# Patient Record
Sex: Male | Born: 1975 | Hispanic: Yes | Marital: Married | Smoking: Never smoker
Health system: Southern US, Community
[De-identification: ages and names within clinical notes are randomized; demographics above are authoritative.]

## PROBLEM LIST (undated history)

## (undated) DIAGNOSIS — I10 Essential (primary) hypertension: Secondary | ICD-10-CM

## (undated) DIAGNOSIS — K219 Gastro-esophageal reflux disease without esophagitis: Secondary | ICD-10-CM

## (undated) HISTORY — DX: Essential (primary) hypertension: I10

---

## 2013-01-10 ENCOUNTER — Other Ambulatory Visit: Payer: Self-pay | Admitting: Family Medicine

## 2013-01-10 DIAGNOSIS — R748 Abnormal levels of other serum enzymes: Secondary | ICD-10-CM

## 2014-11-18 ENCOUNTER — Ambulatory Visit
Admission: RE | Admit: 2014-11-18 | Discharge: 2014-11-18 | Disposition: A | Discharge status: Home / Self Care | Payer: 59 | Source: Ambulatory Visit | Attending: Family Medicine | Admitting: Family Medicine

## 2014-11-18 ENCOUNTER — Other Ambulatory Visit: Payer: Self-pay | Admitting: Family Medicine

## 2014-11-18 DIAGNOSIS — R042 Hemoptysis: Secondary | ICD-10-CM

## 2014-11-25 ENCOUNTER — Other Ambulatory Visit: Payer: Self-pay | Admitting: Family Medicine

## 2014-11-25 DIAGNOSIS — R042 Hemoptysis: Secondary | ICD-10-CM

## 2014-11-26 ENCOUNTER — Other Ambulatory Visit: Payer: 59

## 2014-11-26 ENCOUNTER — Ambulatory Visit
Admission: RE | Admit: 2014-11-26 | Discharge: 2014-11-26 | Disposition: A | Discharge status: Home / Self Care | Payer: 59 | Source: Ambulatory Visit | Attending: Family Medicine | Admitting: Family Medicine

## 2014-11-26 ENCOUNTER — Other Ambulatory Visit: Payer: Self-pay | Admitting: Family Medicine

## 2014-11-26 DIAGNOSIS — R042 Hemoptysis: Secondary | ICD-10-CM

## 2014-11-28 ENCOUNTER — Other Ambulatory Visit: Payer: Self-pay | Admitting: Family Medicine

## 2014-11-28 DIAGNOSIS — R042 Hemoptysis: Secondary | ICD-10-CM

## 2014-11-28 DIAGNOSIS — R9389 Abnormal findings on diagnostic imaging of other specified body structures: Secondary | ICD-10-CM

## 2014-11-29 ENCOUNTER — Emergency Department (HOSPITAL_COMMUNITY): Payer: 59

## 2014-11-29 ENCOUNTER — Encounter (HOSPITAL_COMMUNITY): Payer: Self-pay | Admitting: *Deleted

## 2014-11-29 ENCOUNTER — Other Ambulatory Visit: Payer: 59

## 2014-11-29 ENCOUNTER — Inpatient Hospital Stay (HOSPITAL_COMMUNITY)
Admission: EM | Admit: 2014-11-29 | Discharge: 2014-12-10 | DRG: 165 | Disposition: A | Discharge status: Home / Self Care | Payer: 59 | Attending: Cardiothoracic Surgery | Admitting: Cardiothoracic Surgery

## 2014-11-29 DIAGNOSIS — Z902 Acquired absence of lung [part of]: Secondary | ICD-10-CM

## 2014-11-29 DIAGNOSIS — Z9889 Other specified postprocedural states: Secondary | ICD-10-CM

## 2014-11-29 DIAGNOSIS — R042 Hemoptysis: Secondary | ICD-10-CM

## 2014-11-29 DIAGNOSIS — Q332 Sequestration of lung: Secondary | ICD-10-CM | POA: Diagnosis not present

## 2014-11-29 DIAGNOSIS — K219 Gastro-esophageal reflux disease without esophagitis: Secondary | ICD-10-CM | POA: Diagnosis present

## 2014-11-29 DIAGNOSIS — J939 Pneumothorax, unspecified: Secondary | ICD-10-CM

## 2014-11-29 DIAGNOSIS — R0489 Hemorrhage from other sites in respiratory passages: Secondary | ICD-10-CM | POA: Diagnosis present

## 2014-11-29 HISTORY — DX: Gastro-esophageal reflux disease without esophagitis: K21.9

## 2014-11-29 LAB — HEPATIC FUNCTION PANEL
ALT: 113 U/L — ABNORMAL HIGH (ref 17–63)
AST: 48 U/L — AB (ref 15–41)
Albumin: 4.2 g/dL (ref 3.5–5.0)
Alkaline Phosphatase: 55 U/L (ref 38–126)
BILIRUBIN DIRECT: 0.1 mg/dL (ref 0.1–0.5)
Indirect Bilirubin: 0.6 mg/dL (ref 0.3–0.9)
Total Bilirubin: 0.7 mg/dL (ref 0.3–1.2)
Total Protein: 7.5 g/dL (ref 6.5–8.1)

## 2014-11-29 LAB — CBC WITH DIFFERENTIAL/PLATELET
BASOS PCT: 0 % (ref 0–1)
Basophils Absolute: 0 10*3/uL (ref 0.0–0.1)
EOS ABS: 0.1 10*3/uL (ref 0.0–0.7)
Eosinophils Relative: 1 % (ref 0–5)
HCT: 45.6 % (ref 39.0–52.0)
HEMOGLOBIN: 15.6 g/dL (ref 13.0–17.0)
Lymphocytes Relative: 23 % (ref 12–46)
Lymphs Abs: 2.4 10*3/uL (ref 0.7–4.0)
MCH: 29.4 pg (ref 26.0–34.0)
MCHC: 34.2 g/dL (ref 30.0–36.0)
MCV: 86 fL (ref 78.0–100.0)
Monocytes Absolute: 0.8 10*3/uL (ref 0.1–1.0)
Monocytes Relative: 7 % (ref 3–12)
Neutro Abs: 6.9 10*3/uL (ref 1.7–7.7)
Neutrophils Relative %: 69 % (ref 43–77)
Platelets: 251 10*3/uL (ref 150–400)
RBC: 5.3 MIL/uL (ref 4.22–5.81)
RDW: 13.3 % (ref 11.5–15.5)
WBC: 10.2 10*3/uL (ref 4.0–10.5)

## 2014-11-29 LAB — I-STAT CHEM 8, ED
BUN: 9 mg/dL (ref 6–20)
CHLORIDE: 103 mmol/L (ref 101–111)
CREATININE: 0.8 mg/dL (ref 0.61–1.24)
Calcium, Ion: 1.14 mmol/L (ref 1.12–1.23)
Glucose, Bld: 103 mg/dL — ABNORMAL HIGH (ref 65–99)
HCT: 50 % (ref 39.0–52.0)
HEMOGLOBIN: 17 g/dL (ref 13.0–17.0)
POTASSIUM: 3.2 mmol/L — AB (ref 3.5–5.1)
Sodium: 140 mmol/L (ref 135–145)
TCO2: 24 mmol/L (ref 0–100)

## 2014-11-29 LAB — TYPE AND SCREEN
ABO/RH(D): O POS
Antibody Screen: NEGATIVE

## 2014-11-29 LAB — PROTIME-INR
INR: 1.01 (ref 0.00–1.49)
Prothrombin Time: 13.5 seconds (ref 11.6–15.2)

## 2014-11-29 LAB — MRSA PCR SCREENING: MRSA by PCR: NEGATIVE

## 2014-11-29 LAB — ABO/RH: ABO/RH(D): O POS

## 2014-11-29 LAB — MAGNESIUM: MAGNESIUM: 2 mg/dL (ref 1.7–2.4)

## 2014-11-29 LAB — APTT: APTT: 32 s (ref 24–37)

## 2014-11-29 MED ORDER — POTASSIUM CHLORIDE 10 MEQ/100ML IV SOLN
10.0000 meq | INTRAVENOUS | Status: AC
  Administered 2014-11-29 (×2): 10 meq via INTRAVENOUS
  Filled 2014-11-29 (×2): qty 100

## 2014-11-29 MED ORDER — CETIRIZINE HCL 10 MG PO TABS
5.0000 mg | ORAL_TABLET | Freq: Every day | ORAL | Status: DC
  Administered 2014-11-29 – 2014-12-10 (×10): 5 mg via ORAL
  Filled 2014-11-29 (×14): qty 1

## 2014-11-29 MED ORDER — ONDANSETRON HCL 4 MG/2ML IJ SOLN
4.0000 mg | Freq: Four times a day (QID) | INTRAMUSCULAR | Status: DC | PRN
  Administered 2014-12-06: 4 mg via INTRAVENOUS

## 2014-11-29 MED ORDER — ACETAMINOPHEN 650 MG RE SUPP: 650.0000 mg | Freq: Four times a day (QID) | RECTAL | Status: DC | PRN

## 2014-11-29 MED ORDER — IOHEXOL 350 MG/ML SOLN
100.0000 mL | Freq: Once | INTRAVENOUS | Status: AC | PRN
  Administered 2014-11-29: 100 mL via INTRAVENOUS

## 2014-11-29 MED ORDER — DEXTROSE-NACL 5-0.9 % IV SOLN
INTRAVENOUS | Status: DC
  Administered 2014-11-29 – 2014-12-01 (×5): via INTRAVENOUS

## 2014-11-29 MED ORDER — SODIUM CHLORIDE 0.9 % IV BOLUS (SEPSIS)
1000.0000 mL | Freq: Once | INTRAVENOUS | Status: AC
  Administered 2014-11-29: 1000 mL via INTRAVENOUS

## 2014-11-29 MED ORDER — ONDANSETRON HCL 4 MG PO TABS: 4.0000 mg | ORAL_TABLET | Freq: Four times a day (QID) | ORAL | Status: DC | PRN

## 2014-11-29 MED ORDER — PANTOPRAZOLE SODIUM 40 MG PO TBEC: 40.0000 mg | DELAYED_RELEASE_TABLET | Freq: Every day | ORAL | Status: DC

## 2014-11-29 MED ORDER — SODIUM CHLORIDE 0.9 % IJ SOLN
3.0000 mL | Freq: Two times a day (BID) | INTRAMUSCULAR | Status: DC
  Administered 2014-12-03 – 2014-12-09 (×11): 3 mL via INTRAVENOUS

## 2014-11-29 MED ORDER — ACETAMINOPHEN 325 MG PO TABS
650.0000 mg | ORAL_TABLET | Freq: Four times a day (QID) | ORAL | Status: DC | PRN
  Administered 2014-12-02 – 2014-12-03 (×3): 650 mg via ORAL
  Filled 2014-11-29 (×3): qty 2

## 2014-11-29 MED ORDER — SODIUM CHLORIDE 0.9 % IV SOLN: INTRAVENOUS | Status: AC

## 2014-11-29 MED ORDER — ALUM & MAG HYDROXIDE-SIMETH 200-200-20 MG/5ML PO SUSP
30.0000 mL | Freq: Four times a day (QID) | ORAL | Status: DC | PRN
  Administered 2014-12-01: 30 mL via ORAL
  Filled 2014-11-29: qty 30

## 2014-11-29 NOTE — ED Notes (Signed)
EKG given to EDP,Oni,MD., for review. 

## 2014-11-29 NOTE — ED Provider Notes (Signed)
Pt seen and examined by me. Pt with hemoptysis daily for the last 3 weeks. Denies pain or SOB. Labs urnemarkable. CT obtained with results below.    Results for orders placed or performed during the hospital encounter of 11/29/14  CBC with Differential/Platelet  Result Value Ref Range   WBC 10.2 4.0 - 10.5 K/uL   RBC 5.30 4.22 - 5.81 MIL/uL   Hemoglobin 15.6 13.0 - 17.0 g/dL   HCT 86.5 78.4 - 69.6 %   MCV 86.0 78.0 - 100.0 fL   MCH 29.4 26.0 - 34.0 pg   MCHC 34.2 30.0 - 36.0 g/dL   RDW 29.5 28.4 - 13.2 %   Platelets 251 150 - 400 K/uL   Neutrophils Relative % 69 43 - 77 %   Neutro Abs 6.9 1.7 - 7.7 K/uL   Lymphocytes Relative 23 12 - 46 %   Lymphs Abs 2.4 0.7 - 4.0 K/uL   Monocytes Relative 7 3 - 12 %   Monocytes Absolute 0.8 0.1 - 1.0 K/uL   Eosinophils Relative 1 0 - 5 %   Eosinophils Absolute 0.1 0.0 - 0.7 K/uL   Basophils Relative 0 0 - 1 %   Basophils Absolute 0.0 0.0 - 0.1 K/uL  Protime-INR  Result Value Ref Range   Prothrombin Time 13.5 11.6 - 15.2 seconds   INR 1.01 0.00 - 1.49  I-stat chem 8, ed  Result Value Ref Range   Sodium 140 135 - 145 mmol/L   Potassium 3.2 (L) 3.5 - 5.1 mmol/L   Chloride 103 101 - 111 mmol/L   BUN 9 6 - 20 mg/dL   Creatinine, Ser 4.40 0.61 - 1.24 mg/dL   Glucose, Bld 102 (H) 65 - 99 mg/dL   Calcium, Ion 7.25 3.66 - 1.23 mmol/L   TCO2 24 0 - 100 mmol/L   Hemoglobin 17.0 13.0 - 17.0 g/dL   HCT 44.0 34.7 - 42.5 %   Dg Chest 2 View  11/18/2014   CLINICAL DATA:  Bloody sputum.  Cough for 4 days.  Nonsmoker.  EXAM: CHEST  2 VIEW  COMPARISON:  None.  FINDINGS: Heart size is normal. Lungs are free of focal consolidations. No pleural effusions. There is subsegmental atelectasis in the left lung base.  IMPRESSION: Left lower lobe atelectasis.   Electronically Signed   By: Norva Pavlov M.D.   On: 11/18/2014 15:03   Ct Angio Chest Pe W/cm &/or Wo Cm  11/29/2014   CLINICAL DATA:  39 year old with hemoptysis for 3 weeks. Been treated with  antibiotics.  EXAM: CT ANGIOGRAPHY CHEST WITH CONTRAST  TECHNIQUE: Multidetector CT imaging of the chest was performed using the standard protocol during bolus administration of intravenous contrast. Multiplanar CT image reconstructions and MIPs were obtained to evaluate the vascular anatomy.  CONTRAST:  OMNIPAQUE IOHEXOL 350 MG/ML SOLN  COMPARISON:  None.  FINDINGS: Negative for pulmonary embolism.  No gross abnormality to the thyroid tissue. There is no significant chest lymphadenopathy. No significant pericardial or pleural fluid. Images of the upper abdomen are unremarkable.  There is an enlarged artery originating off the anterior distal descending thoracic aorta on sequence 12, image 162. This vessel extends into the left lower lobe and compatible with an enlarged bronchial artery. There are normal pulmonary arteries in the left lower lobe. Difficult to exclude an arteriovenous fistula. There is typical pulmonary venous drainage on the left side with a superior and inferior pulmonary trunk.  Trachea and mainstem bronchi are patent. There are patchy  parenchymal densities throughout the left lower lobe. This could represent some parenchymal hemorrhage based on the history of hemoptysis. There is mild volume loss in left lower lobe. Patchy densities in right lower lobe may represent atelectasis.  No acute bone abnormality.  Review of the MIP images confirms the above findings.  IMPRESSION: Negative for pulmonary embolism.  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 There are patchy ground-glass densities in the left lower lobe which could represent a small amount of parenchymal hemorrhage along with atelectasis. The pulmonary venous drainage in the left lung appears to be grossly normal. There is no clear evidence for an arteriovenous malformation or fistula but difficult to exclude. No evidence to suggest a pulmonary sequestration.    Electronically Signed   By: Richarda Overlie M.D.   On: 11/29/2014 08:07   US Abdomen Limited Ruq  11/26/2014   CLINICAL DATA:  Spitting up blood ; elevated liver function studies  EXAM: US ABDOMEN LIMITED - RIGHT UPPER QUADRANT  COMPARISON:  PA and lateral chest of November 18, 2014  FINDINGS: Gallbladder:  No gallstones or wall thickening visualized. No sonographic Murphy sign noted.  Common bile duct:  Diameter: 2 mm  Liver:  The liver exhibits normal echotexture. There is no focal mass or ductal dilation. The surface contour is normal.  IMPRESSION: Normal limited right upper quadrant ultrasound examination. If the patient is experiencing hematemesis, abdominal and pelvic CT scanning would be useful. If the patient is experiencing hemoptysis, chest CT scanning may be useful.   Electronically Signed   By: David  [place] M.D.   On: 11/26/2014 09:53    8:46 AM Pt's vs continue to be normal. He denies any complaints. Discussed with Dr. Molli Knock, advised to call vascular vs cardiothoracic specialist.   9:20 AM  Spoke with Dr. Tyrone Sage, advised to admit pt to [facility] ICU vs Stepdown for close monitoring, they will see pt when he gets there.  9:34 AM Spoke with [facility], will admit to [facility] for stepdown bed.   Jaynie Crumble, PA-C 11/29/14 1717  Lorre Nick, MD 11/30/14 [phone]

## 2014-11-29 NOTE — ED Notes (Signed)
Per pts wife - pt has been experiencing hemoptysis x3 weeks, pt was seen by PCP and told it was coming from his sinuses and given a course of zithromax. Pt finished the zithromax and continued to experience hemoptysis and went back to PCP and x-ray was done at that time which showed bronchitis, pt was given a second course of zithromax and an albuterol inhaler. Pt then noted to have elevated liver enzymes however abd [place] that was ordered was normal. Pt continues experiencing pink/red tinged sputum. Pt denies fever, weight loss or night sweats. Pt in no acute distress, speaking complete sentences A&Ox4

## 2014-11-29 NOTE — Progress Notes (Signed)
Patient ID: Henry Guerrero, male   DOB: 1975/11/14, 39 y.o.   MRN: [id number]      [address]       [place] [address]             [phone]        [facility] Medical Record #[id number] Date of Birth: 07/11/1975  Referring: Henry Guerrero ER Primary Care: Emeterio Reeve, MD  Chief Complaint:    Chief Complaint  Patient presents with  . Hemoptysis    History of Present Illness:     Patient is 39 yo male with no previous pulmonary history. 3 weeks ago he noted the beginning of episodes of hemoptysis associated with a tickling in the back of his throat and cough up proximally a teaspoon of red blood. He's had no fever or chills. He's been treated for sinusitis and pneumonia. He was scheduled to have a CT scan this afternoon. Because of recurrent hemoptysis this morning he sought medical attention at the [facility] emergency room. CT scan of the chest was performed. Since admission the patient's had no further hemoptysis, but he was transferred to [facility] for observation and further evaluation of his CT abnormalities.  Current Activity/ Functional Status: Patient is independent with mobility/ambulation, transfers, ADL's, IADL's.   Zubrod Score: At the time of surgery this patient's most appropriate activity status/level should be described as:     0    Normal activity, no symptoms     1    Restricted in physical strenuous activity but ambulatory, able to do out light work     2    Ambulatory and capable of self care, unable to do work activities, up and about                 more than 50%  Of the time                                3    Only limited self care, in bed greater than 50% of waking hours     4    Completely disabled, no self care, confined to bed or chair     5    Moribund  Past Medical History  Diagnosis Date  . GERD (gastroesophageal reflux disease)     History reviewed. No pertinent past surgical history.  History  Smoking  status  . Never Smoker   Smokeless tobacco  .  never used     History  Alcohol Use No    Social History   Social History  . Marital Status: Married    Spouse Name: Henry Guerrero  . Number of Children: 1  . Years of Education: N/A   Occupational History  . 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 Smoking status: Never Smoker   . Smokeless tobacco: Not on file  . Alcohol Use: No  . Drug Use: No  . Sexual Activity: Not on file          Social History Narrative   Married.  Independent of ADLs.    No Known Allergies  Current Facility-Administered Medications  Medication Dose Route Frequency Provider Last Rate Last Dose  . 0.9 %  sodium chloride infusion   Intravenous STAT Tatyana Kirichenko, PA-C      . acetaminophen (TYLENOL) tablet 650 mg  650 mg Oral Q6H PRN Christina P  Rama, MD       Or  . acetaminophen (TYLENOL) suppository 650 mg  650 mg Rectal Q6H PRN Maryruth Bun Rama, MD      . alum & mag hydroxide-simeth (MAALOX/MYLANTA) 200-200-20 MG/5ML suspension 30 mL  30 mL Oral Q6H PRN Christina P Rama, MD      . cetirizine (ZYRTEC) tablet 5 mg  5 mg Oral Daily Christina P Rama, MD      . dextrose 5 %-0.9 % sodium chloride infusion   Intravenous Continuous Christina P Rama, MD      . ondansetron (ZOFRAN) tablet 4 mg  4 mg Oral Q6H PRN Christina P Rama, MD       Or  . ondansetron (ZOFRAN) injection 4 mg  4 mg Intravenous Q6H PRN Christina P Rama, MD      . pantoprazole (PROTONIX) EC tablet 40 mg  40 mg Oral Daily Christina P Rama, MD      . sodium chloride 0.9 % injection 3 mL  3 mL Intravenous Q12H Christina P Rama, MD        Prescriptions prior to admission  Medication Sig Dispense Refill Last Dose  . dexlansoprazole (DEXILANT) 60 MG capsule Take 60 mg by mouth daily.   11/28/2014 at Unknown time  . fluticasone (FLONASE) 50 MCG/ACT nasal spray Place 2 sprays into both nostrils daily.   11/28/2014 at Unknown time  . levocetirizine (XYZAL) 5  MG tablet Take 5 mg by mouth daily.   11/28/2014 at Unknown time    Family History  Problem Relation Age of Onset  . Heart defect Sister    patient's younger sister had a ventricular septal defect repaired at age 13   Review of Systems:      Cardiac Review of Systems: Y or N  Chest Pain [ n   ]  Resting SOB [n   ] Exertional SOB  [  n]  Orthopnea [ n ]   Pedal Edema [n   ]    Palpitations [ n ] Syncope  [n  ]   Presyncope [ n  ]  General Review of Systems: [Y] = yes [  ]=no Constitional: recent weight change [n  ]; anorexia [  ]; fatigue [ n ]; nausea [n  ]; night sweats [n  ]; fever [n  ]; or chills [ n ]                                                               Dental: poor dentition[ n ]; Last Dentist visit:   Eye : blurred vision [  ]; diplopia [   ]; vision changes [  ];  Amaurosis fugax[  ]; Resp: cough [  ];  wheezing[ n ];  hemoptysis[y  ]; shortness of breath[  n; paroxysmal nocturnal dyspnea[ n ]; dyspnea on exertion[  n]; or orthopnea[  ];  GI:  gallstones[  ], vomiting[  ];  dysphagia[  ]; melena[  ];  hematochezia [  ]; heartburn[  ];   Hx of  Colonoscopy[  ]; GU: kidney stones [  ]; hematuria[  ];   dysuria [  ];  nocturia[  ];  history of     obstruction [  ]; urinary frequency [n  ]  Skin: rash, swelling[  ];, hair loss[  ];  peripheral edema[  ];  or itching[  ]; Musculosketetal: myalgias[  ];  joint swelling[  ];  joint erythema[  ];  joint pain[  ];  back pain[  ];  Heme/Lymph: bruising[  ];  bleeding[  ];  anemia[  ];  Neuro: TIA[  n];  headaches[ n ];  stroke[  n];  vertigo[  ];  seizures[  ];   paresthesias[  ];  difficulty walking[  ];  Psych:depression[  ]; anxiety[  ];  Endocrine: diabetes[ n ];  thyroid dysfunction[n  ];  Immunizations: Flu [  ]; Pneumococcal[  ];  Other:  Physical Exam: BP 110/70 mmHg  Pulse 70  Temp(Src) 97.8 F (36.6 C) (Oral)  Resp 18  Ht 5\' 8"  (1.727 m)  Wt 162 lb (73.483 kg)  BMI 24.64 kg/m2  SpO2 99%   General  appearance: alert, cooperative, appears stated age and no distress Head: Normocephalic, without obvious abnormality, atraumatic Neck: no adenopathy, no carotid bruit, no JVD, supple, symmetrical, trachea midline and thyroid not enlarged, symmetric, no tenderness/mass/nodules Lymph nodes: Cervical, supraclavicular, and axillary nodes normal. Resp: clear to auscultation bilaterally Back: symmetric, no curvature. ROM normal. No CVA tenderness. Cardio: regular rate and rhythm, S1, S2 normal, no murmur, click, rub or gallop GI: soft, non-tender; bowel sounds normal; no masses,  no organomegaly Extremities: extremities normal, atraumatic, no cyanosis or edema and Homans sign is negative, no sign of DVT Neurologic: Grossly normal  Diagnostic Studies & Laboratory data:     Recent Radiology Findings:   Ct Angio Chest Pe W/cm &/or Wo Cm  11/29/2014   CLINICAL DATA:  39 year old with hemoptysis for 3 weeks. Been treated with antibiotics.  EXAM: CT ANGIOGRAPHY CHEST WITH CONTRAST  TECHNIQUE: Multidetector CT imaging of the chest was performed using the standard protocol during bolus administration of intravenous contrast. Multiplanar CT image reconstructions and MIPs were obtained to evaluate the vascular anatomy.  CONTRAST:  OMNIPAQUE IOHEXOL 350 MG/ML SOLN  COMPARISON:  None.  FINDINGS: Negative for pulmonary embolism.  No gross abnormality to the thyroid tissue. There is no significant chest lymphadenopathy. No significant pericardial or pleural fluid. Images of the upper abdomen are unremarkable.  There is an enlarged artery originating off the anterior distal descending thoracic aorta on sequence 12, image 162. This vessel extends into the left lower lobe and compatible with an enlarged bronchial artery. There are normal pulmonary arteries in the left lower lobe. Difficult to exclude an arteriovenous fistula. There is typical pulmonary venous drainage on the left side with a superior and inferior  pulmonary trunk.  Trachea and mainstem bronchi are patent. There are patchy parenchymal densities throughout the left lower lobe. This could represent some parenchymal hemorrhage based on the history of hemoptysis. There is mild volume loss in left lower lobe. Patchy densities in right lower lobe may represent atelectasis.  No acute bone abnormality.  Review of the MIP images confirms the above findings.  IMPRESSION: Negative for pulmonary embolism.  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 There are patchy ground-glass densities in the left lower lobe which could represent a small amount of parenchymal hemorrhage along with atelectasis. The pulmonary venous drainage in the left lung appears to be grossly normal. There is no clear evidence for an arteriovenous malformation or fistula but difficult to exclude. No evidence to suggest a pulmonary sequestration.   Electronically Signed  By: Richarda Overlie M.D.   On: 11/29/2014 08:07     I have independently reviewed the above radiologic studies.  Recent Lab Findings: Lab Results  Component Value Date   WBC 10.2 11/29/2014   HGB 17.0 11/29/2014   HCT 50.0 11/29/2014   PLT 251 11/29/2014   GLUCOSE 103* 11/29/2014   ALT 113* 11/29/2014   AST 48* 11/29/2014   NA 140 11/29/2014   K 3.2* 11/29/2014   CL 103 11/29/2014   CREATININE 0.80 11/29/2014   BUN 9 11/29/2014   INR 1.01 11/29/2014      Assessment / Plan:    New onset of hemoptysis, currently not massive  Enlarged bronchial artery originating from the descending thoracic aorta that extends into the left lower lobe- this is suggestive of a intrapulmonary sequestration, most commonly in the lower lobes.  There is no clear evidence for an arteriovenous malformation,  fistula but difficult to exclude on CT. No direct evidence to suggest a pulmonary sequestration  Plan to monitor the patient for further bleeding, I will review the  CT scans with interventional radiology. Considerations include left lower lobectomy, which would be the treatment for pulmonary sequestration or consideration of coiling of the abnormal artery.    [name] MD      [address] [place] [place] [address] Office [phone]   Beeper [phone]  11/29/2014 4:06 PM

## 2014-11-29 NOTE — H&P (Signed)
History and Physical:    [name]   ZOX:[id number] DOB: [birth year] DOA: 11/29/2014  Referring MD/provider: Dr. Mora Bellman PCP: Emeterio Reeve, MD   Chief Complaint: Hemoptysis  History of Present Illness:   Henry Guerrero is an 39 y.o. male with a PMH of GERD who presents to the ED with a 3 week history of coughing up blood.  He saw Dr. Paulino Rily multiple times, he was initially diagnosed with a sinus infection and took a course of azithromycin.  When the cough did not improve, he saw her again, and was referred to an allergist who diagnosed him with rhinitis and placed him on an antihistamine and a steroid nasal spray.  He visited his PCP a 3rd time for non-resolution of symptoms, and had a CXR which showed subsegmental atelectasis and was given a second course of azithromycin and an albuterol inhaler, and scheduled for a CT of the chest.  He presented here due to ongoing hemoptysis, described as bright red, multiple times a day.  Denies any associated shortness of breath or chest pain.  No aggravating factors.    ROS:   Review of Systems  Constitutional: Negative for fever, chills, weight loss and malaise/fatigue.  HENT: Negative for congestion, ear discharge, ear pain, hearing loss, nosebleeds, sore throat and tinnitus.   Eyes: Negative.   Respiratory: Positive for cough, hemoptysis and sputum production. Negative for shortness of breath, wheezing and stridor.   Cardiovascular: Positive for palpitations. Negative for chest pain.  Gastrointestinal: Positive for heartburn. Negative for nausea and vomiting.  Genitourinary: Negative.   Musculoskeletal: Negative.   Skin: Negative for itching and rash.  Neurological: Positive for weakness and headaches. Negative for dizziness.  Endo/Heme/Allergies: Negative for environmental allergies and polydipsia. Does not bruise/bleed easily.  Psychiatric/Behavioral: Negative.     Past Medical History:   Past Medical History  Diagnosis Date    . GERD (gastroesophageal reflux disease)     Past Surgical History:   History reviewed. No pertinent past surgical history.  Social History:   Social History   Social History  . Marital Status: Married    Spouse Name: [name]  . Number of Children: 1  . Years of Education: N/A   Occupational History  . 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 Smoking status: Never Smoker   . Smokeless tobacco: Not on file  . Alcohol Use: No  . Drug Use: No  . Sexual Activity: Not on file   Other Topics Concern  . Not on file   Social History Narrative   Married.  Independent of ADLs.    Family history:   Family History  Problem Relation Age of Onset  . Heart defect Sister     Allergies   Review of patient's allergies indicates no known allergies.  Current Medications:   Prior to Admission medications   Medication Sig Start Date End Date Taking? Authorizing Provider  dexlansoprazole (DEXILANT) 60 MG capsule Take 60 mg by mouth daily.   Yes Historical Provider, MD  fluticasone (FLONASE) 50 MCG/ACT nasal spray Place 2 sprays into both nostrils daily.   Yes Historical Provider, MD  levocetirizine (XYZAL) 5 MG tablet Take 5 mg by mouth daily.   Yes Historical Provider, MD    Physical Exam:   Filed Vitals:   11/29/14 1230 11/29/14 1233 11/29/14 1347 11/29/14 1516  BP: 110/82 110/82 110/70 126/81  Pulse: 71 88 70 65  Temp:    98.1 F (  36.7 C)  TempSrc:    Oral  Resp: Height:      Weight:      SpO2: 99% 100% 99% 98%     Physical Exam: Blood pressure 126/81, pulse 65, temperature 98.1 F (36.7 C), temperature source Oral, resp. rate 18, height  (1.727 m), weight 73.483 kg (162 lb), SpO2 98 %. Gen: No acute distress. Head: Normocephalic, atraumatic. Eyes: PERRL, EOMI, sclerae nonicteric. Mouth: Oropharynx clear. Neck: Supple, no thyromegaly, no lymphadenopathy, no jugular venous distention. Chest: Lungs clear to  auscultation bilaterally. CV: Heart sounds Abdomen: Soft, nontender, nondistended with normal active bowel sounds. Extremities: Extremities are without clubbing, edema, or cyanosis. Skin: Warm and dry. Neuro: Alert and oriented times 3, grossly nonfocal. Psych: Mood and affect normal.   Data Review:    Labs: Basic Metabolic Panel:  Recent Labs Lab 11/29/14 0630  NA 140  K 3.2*  CL 103  GLUCOSE 103*  BUN 9  CREATININE 0.80   Liver Function Tests:  Recent Labs Lab 11/29/14 0947  AST 48*  ALT 113*  ALKPHOS 55  BILITOT 0.7  PROT 7.5  ALBUMIN 4.2   CBC:  Recent Labs Lab 11/29/14 0615 11/29/14 0630  WBC 10.2  --   NEUTROABS 6.9  --   HGB 15.6 17.0  HCT 45.6 50.0  MCV 86.0  --   PLT 251  --     Radiographic Studies: Ct Angio Chest Pe W/cm &/or Wo Cm  11/29/2014   CLINICAL DATA:  39 year old with hemoptysis for 3 weeks. Been treated with antibiotics.  EXAM: CT ANGIOGRAPHY CHEST WITH CONTRAST  TECHNIQUE: Multidetector CT imaging of the chest was performed using the standard protocol during bolus administration of intravenous contrast. Multiplanar CT image reconstructions and MIPs were obtained to evaluate the vascular anatomy.  CONTRAST:  OMNIPAQUE IOHEXOL 350 MG/ML SOLN  COMPARISON:  None.  FINDINGS: Negative for pulmonary embolism.  No gross abnormality to the thyroid tissue. There is no significant chest lymphadenopathy. No significant pericardial or pleural fluid. Images of the upper abdomen are unremarkable.  There is an enlarged artery originating off the anterior distal descending thoracic aorta on sequence 12, image 162. This vessel extends into the left lower lobe and compatible with an enlarged bronchial artery. There are normal pulmonary arteries in the left lower lobe. Difficult to exclude an arteriovenous fistula. There is typical pulmonary venous drainage on the left side with a superior and inferior pulmonary trunk.  Trachea and mainstem bronchi are  patent. There are patchy parenchymal densities throughout the left lower lobe. This could represent some parenchymal hemorrhage based on the history of hemoptysis. There is mild volume loss in left lower lobe. Patchy densities in right lower lobe may represent atelectasis.  No acute bone abnormality.  Review of the MIP images confirms the above findings.  IMPRESSION: Negative for pulmonary embolism.  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 There are patchy ground-glass densities in the left lower lobe which could represent a small amount of parenchymal hemorrhage along with atelectasis. The pulmonary venous drainage in the left lung appears to be grossly normal. There is no clear evidence for an arteriovenous malformation or fistula but difficult to exclude. No evidence to suggest a pulmonary sequestration.   Electronically Signed   By: Richarda Overlie M.D.   On: 11/29/2014 08:07    EKG: Independently reviewed. Normal sinus rhythm without ischemic changes.   Assessment/Plan:  Active Problems:   Pulmonary hemorrhage - EDP discussed with Dr. Tyrone Sage of [facility] who recommended transfer to [facility] SDU. - Definitive treatment per [facility]. May need surgical intervention or a coiling procedure for embolization.  DVT prophylaxis - SCDs only.  Code Status: Full. Family Communication: Wife [phone]), [name]. Disposition Plan: Home when stable, likely 2-3 days depending on intervention.  Time spent: 50 minutes.  RAMA,CHRISTINA [facility] Pager [phone] Cell: [phone]   If 7PM-7AM, please contact night-coverage [email or web address] Password [facility] 11/29/2014, 7:32 PM

## 2014-11-29 NOTE — ED Provider Notes (Signed)
CSN: [id number]     Arrival date & time 11/29/14  0520 History   First MD Initiated Contact with Patient 11/29/14 0606     Chief Complaint  Patient presents with  . Hemoptysis     (Consider location/radiation/quality/duration/timing/severity/associated sxs/prior Treatment) HPI Comments: Uzoma Vivona is a 39 y/o M with no significant pmhx who presents today c/o hemoptysis x 3 weeks. Per pts wife - pt has been experiencing hemoptysis x3 weeks, pt was seen by PCP and told it was coming from his sinuses and given a course of zithromax. Pt finished the zithromax and continued to experience hemoptysis and went back to PCP and x-ray was done at that time which showed bronchitis, pt was given a second course of zithromax and an albuterol inhaler. Pt then noted to have elevated liver enzymes however abd [place] that was ordered was normal. Pt continues experiencing pink/red tinged sputum, post nasal drip, and itchy throat. Pt was scheduled to have a chest CT this morning at 9 am, but woke up with a return of more predominant hemoptysis which prompted a visit to the ED. Pt denies fever, weight loss, chill, night sweats, calf pain, unlateral leg swelling, SOB, or chest pain.  Pt was vaccinated with bcg for TB as an [place]. No personal or family history of lung cancer. Never been a smoker. Pt has a long history of GERD for which he takes pantoprazole prescribed by his PCP. Denies chronic cough. Has never been seen by GI. Denies hematemesis, hematochezia, or melena.   Of note, pt reports that 1.5 months ago he experienced 2 separate episodes of palpitations. Pt states that during these episodes he felt "like he was going to pass out but never did." Pt has never experienced this before. He states that he felt better after sitting down for a few minutes and the palpitations resolved.   Pt is not taking any new medications. No sick contacts.    The history is provided by the patient and the spouse.     History reviewed. No pertinent past medical history. History reviewed. No pertinent past surgical history. History reviewed. No pertinent family history. Social History  Substance Use Topics  . Smoking status: Never Smoker   . Smokeless tobacco: None  . Alcohol Use: No    Review of Systems  Constitutional: Negative for fever, chills, diaphoresis, fatigue and unexpected weight change.  HENT: Positive for congestion, postnasal drip and sneezing. Negative for ear pain, nosebleeds, rhinorrhea, sinus pressure, sore throat and trouble swallowing.   Eyes: Negative for pain, discharge, redness and itching.  Respiratory: Positive for cough. Negative for apnea, chest tightness, shortness of breath, wheezing and stridor.   Cardiovascular: Positive for palpitations ( 2 episodes 1.5 months ago). Negative for chest pain and leg swelling.  Gastrointestinal: Negative for nausea, vomiting, abdominal pain, diarrhea, constipation and blood in stool.  Skin: Negative for color change and pallor.  Neurological: Negative for dizziness, syncope, light-headedness and headaches.  Hematological: Negative for adenopathy. Does not bruise/bleed easily.  All other systems reviewed and are negative.     Allergies  Review of patient's allergies indicates no known allergies.  Home Medications   Prior to Admission medications   Not on File   BP 131/88 mmHg  Pulse 94  Temp(Src) 97.8 F (36.6 C) (Oral)  Resp 16  Ht 5\' 8"  (1.727 m)  Wt 162 lb (73.483 kg)  BMI 24.64 kg/m2  SpO2 100% Physical Exam  Constitutional: He is oriented to  person, place, and time. He appears well-developed and well-nourished. No distress.  HENT:  Head: Normocephalic and atraumatic.  Right Ear: External ear normal.  Left Ear: External ear normal.  Nose: Nose normal.  Mouth/Throat: Oropharynx is clear and moist. No oropharyngeal exudate.  No evidence of blood in nasal passage.   Eyes: Conjunctivae and EOM are normal. Pupils  are equal, round, and reactive to light. Right eye exhibits no discharge. Left eye exhibits no discharge. No scleral icterus.  Neck: Neck supple. No tracheal deviation present.  Cardiovascular: Normal rate, regular rhythm, normal heart sounds and intact distal pulses.  Exam reveals no gallop and no friction rub.   No murmur heard. Pulmonary/Chest: Effort normal and breath sounds normal. No respiratory distress. He has no wheezes. He has no rales. He exhibits no tenderness.  Abdominal: Soft. Bowel sounds are normal. He exhibits no distension. There is no tenderness. There is no rebound and no guarding.  Musculoskeletal: He exhibits no edema.  Lymphadenopathy:    He has no cervical adenopathy.  Neurological: He is alert and oriented to person, place, and time. No cranial nerve deficit.  Skin: Skin is warm and dry. He is not diaphoretic. No erythema.  No evidence of abnormal bleeding or bruising   Psychiatric: He has a normal mood and affect. His behavior is normal.  Vitals reviewed.   ED Course  Procedures (including critical care time)  Pt seen  EKG ordered Labs collected CT chest ordered Parenchymal hemorrhage noted on CT Conulted pulmonolgy. They noted a communication between pulmonary artery and aorta on CT. Recommend consult to CT sursergy. CT surgery consulted. Recommend transfer to [facility] to the step down unit.  Pt transferred at 4:00pm  Labs Review Labs Reviewed  I-STAT CHEM 8, ED - Abnormal; Notable for the following:    Potassium 3.2 (*)    Glucose, Bld 103 (*)    All other components within normal limits  CBC WITH DIFFERENTIAL/PLATELET  PROTIME-INR    Imaging Review Ct Angio Chest Pe W/cm &/or Wo Cm  11/29/2014   CLINICAL DATA:  39 year old with hemoptysis for 3 weeks. Been treated with antibiotics.  EXAM: CT ANGIOGRAPHY CHEST WITH CONTRAST  TECHNIQUE: Multidetector CT imaging of the chest was performed using the standard protocol during bolus administration of  intravenous contrast. Multiplanar CT image reconstructions and MIPs were obtained to evaluate the vascular anatomy.  CONTRAST:  OMNIPAQUE IOHEXOL 350 MG/ML SOLN  COMPARISON:  None.  FINDINGS: Negative for pulmonary embolism.  No gross abnormality to the thyroid tissue. There is no significant chest lymphadenopathy. No significant pericardial or pleural fluid. Images of the upper abdomen are unremarkable.  There is an enlarged artery originating off the anterior distal descending thoracic aorta on sequence 12, image 162. This vessel extends into the left lower lobe and compatible with an enlarged bronchial artery. There are normal pulmonary arteries in the left lower lobe. Difficult to exclude an arteriovenous fistula. There is typical pulmonary venous drainage on the left side with a superior and inferior pulmonary trunk.  Trachea and mainstem bronchi are patent. There are patchy parenchymal densities throughout the left lower lobe. This could represent some parenchymal hemorrhage based on the history of hemoptysis. There is mild volume loss in left lower lobe. Patchy densities in right lower lobe may represent atelectasis.  No acute bone abnormality.  Review of the MIP images confirms the above findings.  IMPRESSION: Negative for pulmonary embolism.  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 There are patchy ground-glass densities in the left lower lobe which could represent a small amount of parenchymal hemorrhage along with atelectasis. The pulmonary venous drainage in the left lung appears to be grossly normal. There is no clear evidence for an arteriovenous malformation or fistula but difficult to exclude. No evidence to suggest a pulmonary sequestration.   Electronically Signed   By: Richarda Overlie M.D.   On: 11/29/2014 08:07   I, Dub Mikes, personally reviewed and evaluated these images and lab results as part of my medical  decision-making.   EKG Interpretation   Date/Time:  Friday November 29 2014 06:50:13 EDT Ventricular Rate:  88 PR Interval:  179 QRS Duration: 82 QT Interval:  350 QTC Calculation: 423 R Axis:   77 Text Interpretation:  Sinus rhythm Confirmed by Erroll Luna  [phone]) on 11/29/2014 6:55:59 AM      MDM   Final diagnoses:  Pulmonary hemorrhage    Pt seen for persistent hemoptysis for 3 weeks. Resistant to treatment with axithromax by PCP. CT chest ordered noting parenchymal hemorrhage. Consult to pulmonology recommended consult to CT surgery after noting communicating PA and aorta on CT. CT surgery recommended transfer to [facility] to be admitted to the step down unit and followed by the CT surgery team. Pt vitals remain stable throughout visit to ED. Pt remained NPO. Pt transferred to [facility] per recommendation by CT surgery and pulmonology.     Lester Kinsman [place], PA-C 11/29/14 1618  Lorre Nick, MD 11/30/14 [phone]

## 2014-11-30 DIAGNOSIS — Q332 Sequestration of lung: Secondary | ICD-10-CM | POA: Insufficient documentation

## 2014-11-30 DIAGNOSIS — R042 Hemoptysis: Secondary | ICD-10-CM

## 2014-11-30 LAB — COMPREHENSIVE METABOLIC PANEL WITH GFR
ALT: 83 U/L — ABNORMAL HIGH (ref 17–63)
AST: 37 U/L (ref 15–41)
Albumin: 3.5 g/dL (ref 3.5–5.0)
Alkaline Phosphatase: 47 U/L (ref 38–126)
Anion gap: 8 (ref 5–15)
BUN: 6 mg/dL (ref 6–20)
CO2: 24 mmol/L (ref 22–32)
Calcium: 8.8 mg/dL — ABNORMAL LOW (ref 8.9–10.3)
Chloride: 106 mmol/L (ref 101–111)
Creatinine, Ser: 0.8 mg/dL (ref 0.61–1.24)
GFR calc Af Amer: >60 mL/min
GFR calc non Af Amer: >60 mL/min
Glucose, Bld: 106 mg/dL — ABNORMAL HIGH (ref 65–99)
Potassium: 3.5 mmol/L (ref 3.5–5.1)
Sodium: 138 mmol/L (ref 135–145)
Total Bilirubin: 1.1 mg/dL (ref 0.3–1.2)
Total Protein: 6 g/dL — ABNORMAL LOW (ref 6.5–8.1)

## 2014-11-30 LAB — CBC WITH DIFFERENTIAL/PLATELET
Basophils Absolute: 0 K/uL (ref 0.0–0.1)
Basophils Relative: 0 % (ref 0–1)
Eosinophils Absolute: 0.1 K/uL (ref 0.0–0.7)
Eosinophils Relative: 2 % (ref 0–5)
HCT: 42.5 % (ref 39.0–52.0)
Hemoglobin: 14.1 g/dL (ref 13.0–17.0)
Lymphocytes Relative: 32 % (ref 12–46)
Lymphs Abs: 2 K/uL (ref 0.7–4.0)
MCH: 28.8 pg (ref 26.0–34.0)
MCHC: 33.2 g/dL (ref 30.0–36.0)
MCV: 86.7 fL (ref 78.0–100.0)
Monocytes Absolute: 0.5 K/uL (ref 0.1–1.0)
Monocytes Relative: 8 % (ref 3–12)
Neutro Abs: 3.6 K/uL (ref 1.7–7.7)
Neutrophils Relative %: 58 % (ref 43–77)
Platelets: 230 K/uL (ref 150–400)
RBC: 4.9 MIL/uL (ref 4.22–5.81)
RDW: 13.5 % (ref 11.5–15.5)
WBC: 6.3 K/uL (ref 4.0–10.5)

## 2014-11-30 LAB — MAGNESIUM: Magnesium: 1.8 mg/dL (ref 1.7–2.4)

## 2014-11-30 MED ORDER — PANTOPRAZOLE SODIUM 40 MG PO TBEC
40.0000 mg | DELAYED_RELEASE_TABLET | Freq: Two times a day (BID) | ORAL | Status: DC
  Administered 2014-11-30 – 2014-12-10 (×19): 40 mg via ORAL
  Filled 2014-11-30 (×16): qty 1

## 2014-11-30 MED ORDER — WHITE PETROLATUM GEL
Status: AC
  Administered 2014-11-30: 10:00:00
  Filled 2014-11-30: qty 1

## 2014-11-30 NOTE — Progress Notes (Signed)
We were consulted but no hemoptysis since admit and comfortable on RA with  Nl hemodynamics  Dr Tyrone Sage sorting out whether he has AVM or sequestration and ? Needs IR embolization but nothing for pulmonary to add at this point - call if needed   Sandrea Hughs, MD Pulmonary and Critical Care Medicine [facility] Cell [phone] After 5:30 PM or weekends, call [phone]

## 2014-11-30 NOTE — Progress Notes (Signed)
Patient ID: Henry Guerrero, male   DOB: 29-Sep-1975, 39 y.o.   MRN: [id number] TCTS DAILY ICU PROGRESS NOTE                   [address]            [name] [address]          [phone]        Total Length of Stay:  LOS: 1 day   Subjective: Feels well, no cough,no hemoptysis since admission  Objective: Vital signs in last 24 hours: Temp:  [98.1 F (36.7 C)-98.9 F (37.2 C)] 98.2 F (36.8 C) (08/13 0807) Pulse Rate:  [65-88] 67 (08/13 0406) Cardiac Rhythm:  [-] Normal sinus rhythm (08/13 0815) Resp:  [15-27] 15 (08/13 0807) BP: (110-134)/(70-85) 134/73 mmHg (08/13 0807) SpO2:  [97 %-100 %] 97 % (08/13 0406)  Filed Weights   11/29/14 0537  Weight: 162 lb (73.483 kg)    Weight change:    Hemodynamic parameters for last 24 hours:    Intake/Output from previous day: 08/12 0701 - 08/13 0700 In: 1400 [I.V.:1400] Out: 1350 [Urine:1350]  Intake/Output this shift: Total I/O In: -  Out: 500 [Urine:500]  Current Meds: Scheduled Meds: . sodium chloride   Intravenous STAT  . cetirizine  5 mg Oral Daily  . pantoprazole  40 mg Oral BID  . sodium chloride  3 mL Intravenous Q12H   Continuous Infusions: . dextrose 5 % and 0.9% NaCl 100 mL/hr at 11/30/14 1100   PRN Meds:.acetaminophen **OR** acetaminophen, alum & mag hydroxide-simeth, ondansetron **OR** ondansetron (ZOFRAN) IV  General appearance: alert, cooperative and no distress Neurologic: intact Heart: regular rate and rhythm, S1, S2 normal, no murmur, click, rub or gallop Lungs: clear to auscultation bilaterally Abdomen: soft, non-tender; bowel sounds normal; no masses,  no organomegaly Extremities: extremities normal, atraumatic, no cyanosis or edema and Homans sign is negative, no sign of DVT  Lab Results: CBC: Recent Labs  11/29/14 0615 11/29/14 0630  WBC 10.2  --   HGB 15.6 17.0  HCT 45.6 50.0  PLT 251  --    BMET:  Recent Labs  11/29/14 0630  NA 140  K 3.2*  CL 103    GLUCOSE 103*  BUN 9  CREATININE 0.80    PT/INR:  Recent Labs  11/29/14 0615  LABPROT 13.5  INR 1.01   Radiology: No results found.   Assessment/Plan: Contacted  IR to review films, considering arteriogram Monday, to define if sequestration is present, poss coiling vs stop and proceed with lobectomy Discussed with patient and wife    [name] 11/30/2014 11:25 AM

## 2014-11-30 NOTE — Consult Note (Addendum)
Chief Complaint: Patient was seen in consultation today for hemoptysis and abnormal CT scan at the request of Dr. Servando Snare  Referring Physician(s): Dr. Ceasar Mons  History of Present Illness: Henry Guerrero is a 39 y.o. male with a three-week history of intermittent gross hemoptysis. He was treated with 2 separate rounds of oral antibiotics without significant improvement. The latest episode was at 4 AM Friday morning with production of approximately 2 tablespoons of bright red blood. The patient went to the emergency room following this episode.  Other than the last 3 weeks, the patient states that he has no prior history of hemoptysis, recurrent pneumonia or other respiratory symptoms. He denies chest pain. He does state that he has frequent nosebleeds. He has no other history of bleeding from other sites. There is no significant family history of bleeding or vascular malformations.  Past Medical History  Diagnosis Date  . GERD (gastroesophageal reflux disease)     History reviewed. No pertinent past surgical history.  Allergies: Review of patient's allergies indicates no known allergies.  Medications: Prior to Admission medications   Medication Sig Start Date End Date Taking? Authorizing Provider  dexlansoprazole (DEXILANT) 60 MG capsule Take 60 mg by mouth daily.   Yes Historical Provider, MD  fluticasone (FLONASE) 50 MCG/ACT nasal spray Place 2 sprays into both nostrils daily.   Yes Historical Provider, MD  levocetirizine (XYZAL) 5 MG tablet Take 5 mg by mouth daily.   Yes Historical Provider, MD     Family History  Problem Relation Age of Onset  . Heart defect Sister     Social History   Social History  . Marital Status: Married    Spouse Name: Barnett Applebaum  . Number of Children: 1  . Years of Education: N/A   Occupational History  . 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 Smoking status: Never Smoker   . Smokeless tobacco:  None  . Alcohol Use: No  . Drug Use: No  . Sexual Activity: Not Asked   Other Topics Concern  . None   Social History Narrative   Married.  Independent of ADLs.     Review of Systems: A 12 point ROS discussed and pertinent positives are indicated in the HPI above.  All other systems are negative.  Review of Systems  Constitutional: Negative.   Respiratory: Positive for cough. Negative for apnea, choking, chest tightness, shortness of breath, wheezing and stridor.   Cardiovascular: Negative.   Gastrointestinal: Negative.   Endocrine: Negative.   Genitourinary: Negative.   Musculoskeletal: Negative.   Neurological: Negative.   Hematological: Negative.      Vital Signs: BP 120/83 mmHg  Pulse 66  Temp(Src) 98.1 F (36.7 C) (Oral)  Resp 15  Ht _0  (1.727 m)  Wt 162 lb (73.483 kg)  BMI 24.64 kg/m2  SpO2 98%  Physical Exam  Imaging: Dg Chest 2 View  11/18/2014   CLINICAL DATA:  Bloody sputum.  Cough for 4 days.  Nonsmoker.  EXAM: CHEST  2 VIEW  COMPARISON:  None.  FINDINGS: Heart size is normal. Lungs are free of focal consolidations. No pleural effusions. There is subsegmental atelectasis in the left lung base.  IMPRESSION: Left lower lobe atelectasis.   Electronically Signed   By: Nolon Nations M.D.   On: 11/18/2014 15:03   Ct Angio Chest Pe W/cm &/or Wo Cm  11/29/2014   CLINICAL DATA:  39 year old with hemoptysis for 3 weeks. Been  treated with antibiotics.  EXAM: CT ANGIOGRAPHY CHEST WITH CONTRAST  TECHNIQUE: Multidetector CT imaging of the chest was performed using the standard protocol during bolus administration of intravenous contrast. Multiplanar CT image reconstructions and MIPs were obtained to evaluate the vascular anatomy.  CONTRAST:  160mL OMNIPAQUE IOHEXOL 350 MG/ML SOLN  COMPARISON:  None.  FINDINGS: Negative for pulmonary embolism.  No gross abnormality to the thyroid tissue. There is no significant chest lymphadenopathy. No significant pericardial or pleural  fluid. Images of the upper abdomen are unremarkable.  There is an enlarged artery originating off the anterior distal descending thoracic aorta on sequence 12, image 162. This vessel extends into the left lower lobe and compatible with an enlarged bronchial artery. There are normal pulmonary arteries in the left lower lobe. Difficult to exclude an arteriovenous fistula. There is typical pulmonary venous drainage on the left side with a superior and inferior pulmonary trunk.  Trachea and mainstem bronchi are patent. There are patchy parenchymal densities throughout the left lower lobe. This could represent some parenchymal hemorrhage based on the history of hemoptysis. There is mild volume loss in left lower lobe. Patchy densities in right lower lobe may represent atelectasis.  No acute bone abnormality.  Review of the MIP images confirms the above findings.  IMPRESSION: Negative for pulmonary embolism.  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 There are patchy ground-glass densities in the left lower lobe which could represent a small amount of parenchymal hemorrhage along with atelectasis. The pulmonary venous drainage in the left lung appears to be grossly normal. There is no clear evidence for an arteriovenous malformation or fistula but difficult to exclude. No evidence to suggest a pulmonary sequestration.   Electronically Signed   By: Markus Daft M.D.   On: 11/29/2014 08:07   US Abdomen Limited Ruq  11/26/2014   CLINICAL DATA:  Spitting up blood ; elevated liver function studies  EXAM: US ABDOMEN LIMITED - RIGHT UPPER QUADRANT  COMPARISON:  PA and lateral chest of November 18, 2014  FINDINGS: Gallbladder:  No gallstones or wall thickening visualized. No sonographic Murphy sign noted.  Common bile duct:  Diameter: 2 mm  Liver:  The liver exhibits normal echotexture. There is no focal mass or ductal dilation. The surface contour is normal.   IMPRESSION: Normal limited right upper quadrant ultrasound examination. If the patient is experiencing hematemesis, abdominal and pelvic CT scanning would be useful. If the patient is experiencing hemoptysis, chest CT scanning may be useful.   Electronically Signed   By: David  Martinique M.D.   On: 11/26/2014 09:53    Labs:  CBC:  Recent Labs  11/29/14 0615 11/29/14 0630 11/30/14 1035  WBC 10.2  --  6.3  HGB 15.6 17.0 14.1  HCT 45.6 50.0 42.5  PLT 251  --  230    COAGS:  Recent Labs  11/29/14 0615  INR 1.01  APTT 32    BMP:  Recent Labs  11/29/14 0630 11/30/14 1035  NA 140 138  K 3.2* 3.5  CL 103 106  CO2  --  24  GLUCOSE 103* 106*  BUN 9 6  CALCIUM  --  8.8*  CREATININE 0.80 0.80  GFRNONAA  --  >60  GFRAA  --  >60    LIVER FUNCTION TESTS:  Recent Labs  11/29/14 0947 11/30/14 1035  BILITOT 0.7 1.1  AST 48* 37  ALT 113* 83*  ALKPHOS 55 47  PROT  7.5 6.0*  ALBUMIN 4.2 3.5    Assessment and Plan:  I met with Mr. Stuckey and his wife who is a Marine scientist at [employer]. He has not had any gross hemoptysis since the episode early Friday morning. He is currently is stable in the hospital with no current symptoms. We reviewed CTA imaging findings.  The CTA shows an abnormal, hypertrophic bronchial artery emanating off the anterior thoracic aorta and showing a tortuous course into the left lower lobe. This arterial trunk measures 7-8 mm in diameter. There is no evidence of a true arteriovenous malformation or AV fistula. 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 There is no isolated abnormal lung by CT that is invested by pleura, but this enlarged bronchial artery would have to be supplying some portion of the left lower lobe parenchyma.  I told the patient and his wife that diagnostic arteriography with selective arteriography of the abnormal bronchial artery would be potentially helpful in determining  how much of the left lower lobe this vessel supplies and to evaluate for normal pulmonary venous drainage. In the same setting, additional left-sided pulmonary arteriography may also be helpful to evaluate the pulmonary arterial supply and obtain pulmonary and right heart pressures. The only role for embolization would be to treat severe hemorrhage as a bridge to surgery. Elective embolization would likely cause necrosis and infarction of a segment of the left lower lobe which may result in additional complications.  Depending on course over the weekend, I told the patient that we would likely be performing diagnostic arteriography on Monday. I told Mr. Windmiller and his wife that I will also discuss this plan with Dr. Servando Snare. If it looks like we will be performing arteriography on Monday, consent and additional preparation will be performed tomorrow.  Thank you for this interesting consult.  I greatly enjoyed meeting Tashi Band and look forward to participating in their care.  A copy of this report was sent to the requesting provider on this date.  SignedAletta Edouard T 11/30/2014, 1:08 PM   I spent a total of 40 minutes face to face in clinical consultation, all of which was spent counseling/coordinating care for possible arteriography and embolization to treat hemoptysis.

## 2014-11-30 NOTE — Progress Notes (Signed)
[place] TEAM 1 - Stepdown/ICU TEAM Progress Note  Jonanthan Bolender JXB:[id number] DOB: 1975-05-26 DOA: 11/29/2014 PCP: Emeterio Reeve, MD  Admit HPI / Brief Narrative: 39 yo HM no previous pulmonary history. 3 weeks ago he noted the beginning of episodes of hemoptysis associated with a tickling in the back of his throat and cough up proximally a teaspoon of red blood. He's had no fever or chills. He's been treated for sinusitis and pneumonia. He was scheduled to have a CT scan this afternoon. Because of recurrent hemoptysis this morning he sought medical attention at the [facility] emergency room. CT scan of the chest was performed. Since admission the patient's had no further hemoptysis, but he was transferred to [facility] for observation and further evaluation of his CT abnormalities.  HPI/Subjective: 8/13 A/O 4, NAD. States negative hemoptysis overnight, negative SOB, negative CP.   Assessment/Plan: Pulmonary hemorrhage - EDP discussed with Dr. Tyrone Sage of [facility] who recommended transfer to [facility] SDU. - Definitive treatment per [facility]. May need surgical intervention or a coiling procedure for embolization. -Appears [facility]TS has not finalized the point and for dealing with pulmonary hemorrhage at this time. Will monitor closely for any additional hemoptysis. -Counseled both patient and his wife on the need to immediately inform us if he has additional episodes of hemoptysis.     Code Status: FULL Family Communication: Wife present at time of exam Disposition Plan: Per cardiothoracic surgery    Consultants: Dr.Edward Bari Edward (cardiothoracic surgery) Dr.Michael B Wert [facility] M)    Procedure/Significant Events: 8/12 CT angiogram chest PE protocol;-Negative PE -Enlarged bronchial artery originating from the descending thoracic aorta that extends into the left lower lobe. -Patchy ground-glass densities LLL could represent  small amount of parenchymal hemorrhage along with atelectasis.  -pulmonary venous drainage left lung grossly normal.  -no evidence AVM or fistula but difficult to exclude.  -No evidence to suggest pulmonary sequestration.   Culture NA  Antibiotics: NA  DVT prophylaxis: SCD   Devices NA   LINES / TUBES:      Continuous Infusions: . dextrose 5 % and 0.9% NaCl 100 mL/hr at 11/30/14 0246    Objective: VITAL SIGNS: Temp: 98.3 F (36.8 C) (08/13 0406) Temp Source: Oral (08/13 0406) BP: 113/76 mmHg (08/13 0406) Pulse Rate: 67 (08/13 0406) SPO2; FIO2:   Intake/Output Summary (Last 24 hours) at 11/30/14 0744 Last data filed at 11/30/14 0600  Gross per 24 hour  Intake   1400 ml  Output   1350 ml  Net     50 ml     Exam: General: A/O 4, NAD. States negative hemoptysis overnight, No acute respiratory distress Eyes: Negative headache, eye pain, double vision,negative scleral hemorrhage ENT: Negative Runny nose, negative ear pain, negative tinnitus, negative gingival bleeding, Neck:  Negative scars, masses, torticollis, lymphadenopathy, JVD Lungs: Clear to auscultation bilaterally without wheezes or crackles Cardiovascular: Regular rate and rhythm without murmur gallop or rub normal S1 and S2 Abdomen:negative abdominal pain, negative dysphagia, nondistended, positive soft, bowel sounds, no rebound, no ascites, no appreciable mass Extremities: No significant cyanosis, clubbing, or edema bilateral lower extremities Psychiatric:  Negative depression, negative anxiety, negative fatigue, negative mania  Neurologic:  Cranial nerves II through XII intact, tongue/uvula midline, all extremities muscle strength 5/5, sensation intact throughout, negative dysarthria, negative expressive aphasia, negative receptive aphasia.      Data Reviewed: Basic Metabolic Panel:  Recent Labs Lab 11/29/14 0630 11/29/14 2207  NA 140  --   K 3.2*  --  CL 103  --   GLUCOSE 103*  --   BUN 9  --   CREATININE 0.80  --   MG  --  2.0   Liver  Function Tests:  Recent Labs Lab 11/29/14 0947  AST 48*  ALT 113*  ALKPHOS 55  BILITOT 0.7  PROT 7.5  ALBUMIN 4.2   No results for input(s): LIPASE, AMYLASE in the last 168 hours. No results for input(s): AMMONIA in the last 168 hours. CBC:  Recent Labs Lab 11/29/14 0615 11/29/14 0630  WBC 10.2  --   NEUTROABS 6.9  --   HGB 15.6 17.0  HCT 45.6 50.0  MCV 86.0  --   PLT 251  --    Cardiac Enzymes: No results for input(s): CKTOTAL, CKMB, CKMBINDEX, TROPONINI in the last 168 hours. BNP (last 3 results) No results for input(s): BNP in the last 8760 hours.  ProBNP (last 3 results) No results for input(s): PROBNP in the last 8760 hours.  CBG: No results for input(s): GLUCAP in the last 168 hours.  Recent Results (from the past 240 hour(s))  MRSA PCR Screening     Status: None   Collection Time: 11/29/14  3:22 PM  Result Value Ref Range Status   MRSA by PCR NEGATIVE NEGATIVE Final    Comment:        The GeneXpert MRSA Assay (FDA approved for NASAL specimens only), is one component of a comprehensive MRSA colonization surveillance program. It is not intended to diagnose MRSA infection nor to guide or monitor treatment for MRSA infections.      Studies:  Recent x-ray studies have been reviewed in detail by the Attending Physician  Scheduled Meds:  Scheduled Meds: . sodium chloride   Intravenous STAT  . cetirizine  5 mg Oral Daily  . pantoprazole  40 mg Oral Daily  . sodium chloride  3 mL Intravenous Q12H    Time spent on care of this patient: 40 mins   Joud Ingwersen, Roselind Messier , MD  [facility] Office  [phone] Pager [phone]  On-Call/Text Page:      [email or web address]      password [email or web address]  If 7PM-7AM, please contact night-coverage [email or web address] Password [facility] 11/30/2014, 7:44 AM   LOS: 1 day   Care during the described time interval was provided by me .  I have reviewed this patient's available data, including medical history, events of note,  physical examination, and all test results as part of my evaluation. I have personally reviewed and interpreted all radiology studies.   Carolyne Littles, MD [phone] Pager

## 2014-12-01 ENCOUNTER — Inpatient Hospital Stay (HOSPITAL_COMMUNITY): Payer: 59

## 2014-12-01 DIAGNOSIS — R042 Hemoptysis: Secondary | ICD-10-CM | POA: Insufficient documentation

## 2014-12-01 NOTE — Progress Notes (Signed)
Patient ID: Henry Guerrero, male   DOB: 1975-08-15, 39 y.o.   MRN: [id number]      [address]       [place] [address]             [phone]                      LOS: 2 days   Subjective: Small amount of dark blood this am, then no more  Objective: Vital signs in last 24 hours: Patient Vitals for the past 24 hrs:  BP Temp Temp src Pulse Resp SpO2  12/01/14 0847 138/85 mmHg - - 68 19 95 %  12/01/14 0725 118/83 mmHg 97.6 F (36.4 C) Oral - - -  12/01/14 0310 115/84 mmHg 98.3 F (36.8 C) Oral 71 20 97 %  11/30/14 2330 123/80 mmHg 98.4 F (36.9 C) Oral 84 (!) 23 95 %  11/30/14 1953 119/74 mmHg 98.1 F (36.7 C) Oral 89 19 96 %  11/30/14 1634 115/79 mmHg 98.1 F (36.7 C) Oral 81 17 98 %    Filed Weights   11/29/14 0537  Weight: 162 lb (73.483 kg)    Hemodynamic parameters for last 24 hours:    Intake/Output from previous day: 08/13 0701 - 08/14 0700 In: 1720 [P.O.:120; I.V.:1600] Out: 1551 [Urine:1550; Stool:1] Intake/Output this shift: Total I/O In: 120 [P.O.:120] Out: -   Scheduled Meds: . cetirizine  5 mg Oral Daily  . pantoprazole  40 mg Oral BID  . sodium chloride  3 mL Intravenous Q12H   Continuous Infusions: . dextrose 5 % and 0.9% NaCl 100 mL/hr at 12/01/14 1100   PRN Meds:.acetaminophen **OR** acetaminophen, alum & mag hydroxide-simeth, ondansetron **OR** ondansetron (ZOFRAN) IV  General appearance: alert, cooperative, appears stated age and no distress Neurologic: intact Heart: regular rate and rhythm, S1, S2 normal, no murmur, click, rub or gallop Lungs: clear to auscultation bilaterally Abdomen: soft, non-tender; bowel sounds normal; no masses,  no organomegaly Extremities: extremities normal, atraumatic, no cyanosis or edema and Homans sign is negative, no sign of DVT  Lab Results: CBC: Recent Labs  11/29/14 0615 11/29/14 0630 11/30/14 1035  WBC 10.2  --  6.3  HGB 15.6 17.0 14.1  HCT 45.6 50.0 42.5  PLT 251  --   230   BMET:  Recent Labs  11/29/14 0630 11/30/14 1035  NA 140 138  K 3.2* 3.5  CL 103 106  CO2  --  24  GLUCOSE 103* 106*  BUN 9 6  CREATININE 0.80 0.80  CALCIUM  --  8.8*    PT/INR:  Recent Labs  11/29/14 0615  LABPROT 13.5  INR 1.01     Radiology Dg Chest Port 1 View  12/01/2014   CLINICAL DATA:  Hemoptysis.  EXAM: PORTABLE CHEST - 1 VIEW  COMPARISON:  None.  FINDINGS: The heart size and mediastinal contours are within normal limits. Both lungs are clear. No pneumothorax or pleural effusion is noted. The visualized skeletal structures are unremarkable.  IMPRESSION: No acute cardiopulmonary abnormality seen.   Electronically Signed   By: Lupita Raider, M.D.   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 Likely will need lobectomy  [name] MD 12/01/2014 11:50 AM

## 2014-12-01 NOTE — Consult Note (Signed)
Chief Complaint: Patient was seen in consultation today for hemoptysis Chief Complaint  Patient presents with  . Hemoptysis   at the request of Dr Tyrone Sage  Referring Physician(s): Dr Tyrone Sage  History of Present Illness: Henry Guerrero is a 39 y.o. male   Pt has had 3 week hx off and on hemoptysis antibx treatment - no relief Now admitted for same and evaluation CT:  IMPRESSION: Negative for pulmonary embolism.  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 There are patchy ground-glass densities in the left lower lobe which could represent a small amount of parenchymal hemorrhage along with atelectasis. The pulmonary venous drainage in the left lung appears to be grossly normal. There is no clear evidence for an arteriovenous malformation or fistula but difficult to exclude. No evidence to suggest a pulmonary sequestration.  Dr Tyrone Sage has requested bronchial arteriogram with possible embolization See Dr Fredia Sorrow consult note I have seen and examined pt Now scheduled for same 8/15  Past Medical History  Diagnosis Date  . GERD (gastroesophageal reflux disease)     History reviewed. No pertinent past surgical history.  Allergies: Review of patient's allergies indicates no known allergies.  Medications: Prior to Admission medications   Medication Sig Start Date End Date Taking? Authorizing Provider  dexlansoprazole (DEXILANT) 60 MG capsule Take 60 mg by mouth daily.   Yes Historical Provider, MD  fluticasone (FLONASE) 50 MCG/ACT nasal spray Place 2 sprays into both nostrils daily.   Yes Historical Provider, MD  levocetirizine (XYZAL) 5 MG tablet Take 5 mg by mouth daily.   Yes Historical Provider, MD     Family History  Problem Relation Age of Onset  . Heart defect Sister     Social History   Social History  . Marital Status: Married    Spouse Name: Henry Guerrero  . Number of Children: 1    . Years of Education: N/A   Occupational History  . 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 Smoking status: Never Smoker   . Smokeless tobacco: None  . Alcohol Use: No  . Drug Use: No  . Sexual Activity: Not Asked   Other Topics Concern  . None   Social History Narrative   Married.  Independent of ADLs.     Review of Systems: A 12 point ROS discussed and pertinent positives are indicated in the HPI above.  All other systems are negative.  Review of Systems  Constitutional: Negative for activity change, appetite change and fatigue.  Respiratory: Positive for cough.        Hemoptysis  Cardiovascular: Negative for chest pain.  Neurological: Negative for weakness.  Psychiatric/Behavioral: Negative for behavioral problems and confusion.    Vital Signs: BP 138/85 mmHg  Pulse 68  Temp(Src) 97.6 F (36.4 C) (Oral)  Resp 19  Ht 5\' 8"  (1.727 m)  Wt 162 lb (73.483 kg)  BMI 24.64 kg/m2  SpO2 95%  Physical Exam  Constitutional: He is oriented to person, place, and time.  Cardiovascular: Normal rate, regular rhythm and normal heart sounds.   No murmur heard. Pulmonary/Chest: Effort normal and breath sounds normal. He has no wheezes.  Pt did have small amt hemoptysis this am  Abdominal: Soft. Bowel sounds are normal. There is no tenderness.  Musculoskeletal: Normal range of motion.  Neurological: He is alert and oriented to person, place, and time.  Skin: Skin is warm and dry.  Psychiatric: He has a normal mood and affect. His behavior is normal. Thought content normal.  Nursing note and vitals reviewed.   Mallampati Score:  MD Evaluation Airway: WNL Heart: WNL Abdomen: WNL Chest/ Lungs: WNL ASA  Classification: 2 Mallampati/Airway Score: One  Imaging: Dg Chest 2 View  11/18/2014   CLINICAL DATA:  Bloody sputum.  Cough for 4 days.  Nonsmoker.  EXAM: CHEST  2 VIEW  COMPARISON:  None.  FINDINGS: Heart size is normal. Lungs are  free of focal consolidations. No pleural effusions. There is subsegmental atelectasis in the left lung base.  IMPRESSION: Left lower lobe atelectasis.   Electronically Signed   By: Norva Pavlov M.D.   On: 11/18/2014 15:03   Ct Angio Chest Pe W/cm &/or Wo Cm  11/29/2014   CLINICAL DATA:  39 year old with hemoptysis for 3 weeks. Been treated with antibiotics.  EXAM: CT ANGIOGRAPHY CHEST WITH CONTRAST  TECHNIQUE: Multidetector CT imaging of the chest was performed using the standard protocol during bolus administration of intravenous contrast. Multiplanar CT image reconstructions and MIPs were obtained to evaluate the vascular anatomy.  CONTRAST:  OMNIPAQUE IOHEXOL 350 MG/ML SOLN  COMPARISON:  None.  FINDINGS: Negative for pulmonary embolism.  No gross abnormality to the thyroid tissue. There is no significant chest lymphadenopathy. No significant pericardial or pleural fluid. Images of the upper abdomen are unremarkable.  There is an enlarged artery originating off the anterior distal descending thoracic aorta on sequence 12, image 162. This vessel extends into the left lower lobe and compatible with an enlarged bronchial artery. There are normal pulmonary arteries in the left lower lobe. Difficult to exclude an arteriovenous fistula. There is typical pulmonary venous drainage on the left side with a superior and inferior pulmonary trunk.  Trachea and mainstem bronchi are patent. There are patchy parenchymal densities throughout the left lower lobe. This could represent some parenchymal hemorrhage based on the history of hemoptysis. There is mild volume loss in left lower lobe. Patchy densities in right lower lobe may represent atelectasis.  No acute bone abnormality.  Review of the MIP images confirms the above findings.  IMPRESSION: Negative for pulmonary embolism.  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 There are patchy  ground-glass densities in the left lower lobe which could represent a small amount of parenchymal hemorrhage along with atelectasis. The pulmonary venous drainage in the left lung appears to be grossly normal. There is no clear evidence for an arteriovenous malformation or fistula but difficult to exclude. No evidence to suggest a pulmonary sequestration.   Electronically Signed   By: Richarda Overlie M.D.   On: 11/29/2014 08:07   US Abdomen Limited Ruq  11/26/2014   CLINICAL DATA:  Spitting up blood ; elevated liver function studies  EXAM: US ABDOMEN LIMITED - RIGHT UPPER QUADRANT  COMPARISON:  PA and lateral chest of November 18, 2014  FINDINGS: Gallbladder:  No gallstones or wall thickening visualized. No sonographic Murphy sign noted.  Common bile duct:  Diameter: 2 mm  Liver:  The liver exhibits normal echotexture. There is no focal mass or ductal dilation. The surface contour is normal.  IMPRESSION: Normal limited right upper quadrant ultrasound examination. If the patient is experiencing hematemesis, abdominal and pelvic CT scanning would be useful. If the patient is experiencing hemoptysis, chest CT scanning may be useful.   Electronically Signed   By: David  [place] M.D.   On: 11/26/2014 09:53  Labs:  CBC:  Recent Labs  11/29/14 0615 11/29/14 0630 11/30/14 1035  WBC 10.2  --  6.3  HGB 15.6 17.0 14.1  HCT 45.6 50.0 42.5  PLT 251  --  230    COAGS:  Recent Labs  11/29/14 0615  INR 1.01  APTT 32    BMP:  Recent Labs  11/29/14 0630 11/30/14 1035  NA 140 138  K 3.2* 3.5  CL 103 106  CO2  --  24  GLUCOSE 103* 106*  BUN 9 6  CALCIUM  --  8.8*  CREATININE 0.80 0.80  GFRNONAA  --  >60  GFRAA  --  >60    LIVER FUNCTION TESTS:  Recent Labs  11/29/14 0947 11/30/14 1035  BILITOT 0.7 1.1  AST 48* 37  ALT 113* 83*  ALKPHOS 55 47  PROT 7.5 6.0*  ALBUMIN 4.2 3.5    TUMOR MARKERS: No results for input(s): AFPTM, CEA, CA199, CHROMGRNA in the last 8760  hours. Interventional Radiology Procedure Request    Assessment and Plan:  Off and on hemoptysis x 3 weeks Small amt this am Consult with Dr Fredia Sorrow Now scheduled for Thoracic Aortagram and Bronchial arteriography with possible Embolization and possible Pulmonary arteriography Risks and Benefits discussed with the patient including, but not limited to bleeding, infection, vascular injury or contrast induced renal failure. All of the patient's questions were answered, patient is agreeable to proceed. Consent signed and in chart.  Thank you for this interesting consult.  I greatly enjoyed meeting Daishaun Ayre and look forward to participating in their care.  A copy of this report was sent to the requesting provider on this date.  Signed: Mataeo Ingwersen A 12/01/2014, 9:10 AM   I spent a total of 40 Minutes    in face to face in clinical consultation, greater than 50% of which was counseling/coordinating care for bronchial arteriography with poss embolization

## 2014-12-01 NOTE — Progress Notes (Signed)
[place] TEAM 1 - Stepdown/ICU TEAM Progress Note  Stefano Trulson ZOX:[id number] DOB: 04-10-1976 DOA: 11/29/2014 PCP: Emeterio Reeve, MD  Admit HPI / Brief Narrative: 39 yo HM no previous pulmonary history. 3 weeks ago he noted the beginning of episodes of hemoptysis associated with a tickling in the back of his throat and cough up proximally a teaspoon of red blood. He's had no fever or chills. He's been treated for sinusitis and pneumonia. He was scheduled to have a CT scan this afternoon. Because of recurrent hemoptysis this morning he sought medical attention at the [facility] emergency room. CT scan of the chest was performed. Since admission the patient's had no further hemoptysis, but he was transferred to [facility] for observation and further evaluation of his CT abnormalities.  HPI/Subjective: 8/14 A/O 4, NAD. States positive hemoptysis overnight, negative SOB, negative CP.   Assessment/Plan: Pulmonary hemorrhage - EDP discussed with Dr. Tyrone Sage of [facility] who recommended transfer to [facility] SDU. - Definitive treatment per CVTS. May need surgical intervention or a coiling procedure for embolization. -Arteriogram planned for the a.m. with possible lobectomy on Tuesday.     Code Status: FULL Family Communication: Family present at time of exam Disposition Plan: Per cardiothoracic surgery    Consultants: Dr.Edward Bari Edward (cardiothoracic surgery) Dr.Michael B Wert [facility] M)    Procedure/Significant Events: 8/12 CT angiogram chest PE protocol;-Negative PE -Enlarged bronchial artery originating from the descending thoracic aorta that extends into the left lower lobe. -Patchy ground-glass densities LLL could represent  small amount of parenchymal hemorrhage along with atelectasis. -pulmonary venous drainage left lung grossly normal.  -no evidence AVM or fistula but difficult to exclude.  -No evidence to suggest pulmonary sequestration.   Culture NA  Antibiotics: NA  DVT  prophylaxis: SCD   Devices NA   LINES / TUBES:      Continuous Infusions: . dextrose 5 % and 0.9% NaCl 100 mL/hr at 12/01/14 1700    Objective: VITAL SIGNS: Temp: 97.8 F (36.6 C) (08/14 1554) Temp Source: Oral (08/14 1554) BP: 120/75 mmHg (08/14 1554) Pulse Rate: 88 (08/14 1554) SPO2; FIO2:   Intake/Output Summary (Last 24 hours) at 12/01/14 1822 Last data filed at 12/01/14 1400  Gross per 24 hour  Intake   1660 ml  Output   1750 ml  Net    -90 ml     Exam: General: A/O 4, NAD. States negative hemoptysis overnight, No acute respiratory distress Eyes: Negative headache, eye pain, double vision,negative scleral hemorrhage ENT: Negative Runny nose, negative ear pain, negative tinnitus, negative gingival bleeding, Neck:  Negative scars, masses, torticollis, lymphadenopathy, JVD Lungs: Clear to auscultation bilaterally without wheezes or crackles Cardiovascular: Regular rate and rhythm without murmur gallop or rub normal S1 and S2 Abdomen:negative abdominal pain, negative dysphagia, nondistended, positive soft, bowel sounds, no rebound, no ascites, no appreciable mass Extremities: No significant cyanosis, clubbing, or edema bilateral lower extremities Psychiatric:  Negative depression, negative anxiety, negative fatigue, negative mania  Neurologic:  Cranial nerves II through XII intact, tongue/uvula midline, all extremities muscle strength 5/5, sensation intact throughout, negative dysarthria, negative expressive aphasia, negative receptive aphasia.      Data Reviewed: Basic Metabolic Panel:  Recent Labs Lab 11/29/14 0630 11/29/14 2207 11/30/14 1035  NA 140  --  138  K 3.2*  --  3.5  CL 103  --  106  CO2  --   --  24  GLUCOSE 103*  --  106*  BUN 9  --  6  CREATININE 0.80  --  0.80  CALCIUM  --   --  8.8*  MG  --  2.0 1.8   Liver Function Tests:  Recent Labs Lab 11/29/14 0947 11/30/14 1035  AST 48* 37  ALT 113* 83*  ALKPHOS 55 47  BILITOT 0.7  1.1  PROT 7.5 6.0*  ALBUMIN 4.2 3.5   No results for input(s): LIPASE, AMYLASE in the last 168 hours. No results for input(s): AMMONIA in the last 168 hours. CBC:  Recent Labs Lab 11/29/14 0615 11/29/14 0630 11/30/14 1035  WBC 10.2  --  6.3  NEUTROABS 6.9  --  3.6  HGB 15.6 17.0 14.1  HCT 45.6 50.0 42.5  MCV 86.0  --  86.7  PLT 251  --  230   Cardiac Enzymes: No results for input(s): CKTOTAL, CKMB, CKMBINDEX, TROPONINI in the last 168 hours. BNP (last 3 results) No results for input(s): BNP in the last 8760 hours.  ProBNP (last 3 results) No results for input(s): PROBNP in the last 8760 hours.  CBG: No results for input(s): GLUCAP in the last 168 hours.  Recent Results (from the past 240 hour(s))  MRSA PCR Screening     Status: None   Collection Time: 11/29/14  3:22 PM  Result Value Ref Range Status   MRSA by PCR NEGATIVE NEGATIVE Final    Comment:        The GeneXpert MRSA Assay (FDA approved for NASAL specimens only), is one component of a comprehensive MRSA colonization surveillance program. It is not intended to diagnose MRSA infection nor to guide or monitor treatment for MRSA infections.      Studies:  Recent x-ray studies have been reviewed in detail by the Attending Physician  Scheduled Meds:  Scheduled Meds: . cetirizine  5 mg Oral Daily  . pantoprazole  40 mg Oral BID  . sodium chloride  3 mL Intravenous Q12H    Time spent on care of this patient: 40 mins   Patience Nuzzo, Roselind Messier , MD  [facility] Office  [phone] Pager - [phone]  On-Call/Text Page:      [email or web address]      password [email or web address]  If 7PM-7AM, please contact night-coverage [email or web address] Password [facility] 12/01/2014, 6:22 PM   LOS: 2 days   Care during the described time interval was provided by me .  I have reviewed this patient's available data, including medical history, events of note, physical examination, and all test results as part of my evaluation. I have  personally reviewed and interpreted all radiology studies.   Carolyne Littles, MD [phone] Pager

## 2014-12-01 NOTE — Care Management Note (Addendum)
Case Management Note  Patient Details  Name: Henry Guerrero MRN: [id number] Date of Birth: 18-Jul-1975  Subjective/Objective:             Pt from home with family,  Independent with ADLS admitted with pulmonary hemorrhage.       Action/Plan: Plan arteriogram per IR  12/02/14. CM to f/u with disposition needs.  Expected Discharge Date:   (unknown)               Expected Discharge Plan:  Home/Self Care  In-House Referral:     Discharge planning Services  CM Consult  Post Acute Care Choice:    Choice offered to:     DME Arranged:    DME Agency:     HH Arranged:    HH Agency:     Status of Service:  In process, will continue to follow  Medicare Important Message Given:    Date Medicare IM Given:    Medicare IM give by:    Date Additional Medicare IM Given:    Additional Medicare Important Message give by:     If discussed at Long Length of Stay Meetings, dates discussed:    Additional Comments: Lesly Joslyn (Spouse)  [phone]  Gae Gallop [place], [place] [phone] 12/01/2014, 7:24 PM

## 2014-12-01 NOTE — Progress Notes (Signed)
UR COMPLETED  

## 2014-12-01 NOTE — Progress Notes (Addendum)
Patient cough up a quarter size dark blood stained mucus. Vital signs BP- 138/85, HR 84, RR- 18, O2 % 96, No sortness of breath and No complaints of pain. Paged Dr. Joseph Art. Will continue to monitor. Spoke with Dr. Joseph Art and updated, STAT PCXR ordered , continue to monitor.

## 2014-12-02 ENCOUNTER — Inpatient Hospital Stay (HOSPITAL_COMMUNITY): Payer: 59

## 2014-12-02 LAB — CBC
HEMATOCRIT: 43.5 % (ref 39.0–52.0)
Hemoglobin: 14.6 g/dL (ref 13.0–17.0)
MCH: 29.3 pg (ref 26.0–34.0)
MCHC: 33.6 g/dL (ref 30.0–36.0)
MCV: 87.3 fL (ref 78.0–100.0)
Platelets: 230 10*3/uL (ref 150–400)
RBC: 4.98 MIL/uL (ref 4.22–5.81)
RDW: 13.4 % (ref 11.5–15.5)
WBC: 7.6 10*3/uL (ref 4.0–10.5)

## 2014-12-02 LAB — BASIC METABOLIC PANEL
ANION GAP: 8 (ref 5–15)
BUN: 6 mg/dL (ref 6–20)
CALCIUM: 8.9 mg/dL (ref 8.9–10.3)
CO2: 25 mmol/L (ref 22–32)
Chloride: 109 mmol/L (ref 101–111)
Creatinine, Ser: 0.72 mg/dL (ref 0.61–1.24)
GFR calc Af Amer: >60 mL/min (ref >60)
GLUCOSE: 97 mg/dL (ref 65–99)
POTASSIUM: 3.6 mmol/L (ref 3.5–5.1)
SODIUM: 142 mmol/L (ref 135–145)

## 2014-12-02 LAB — PROTIME-INR
INR: 1.03 (ref 0.00–1.49)
Prothrombin Time: 13.7 seconds (ref 11.6–15.2)

## 2014-12-02 LAB — APTT: APTT: 33 s (ref 24–37)

## 2014-12-02 MED ORDER — MIDAZOLAM HCL 2 MG/2ML IJ SOLN
INTRAMUSCULAR | Status: AC
  Filled 2014-12-02: qty 4

## 2014-12-02 MED ORDER — SODIUM CHLORIDE 0.9 % IV SOLN
INTRAVENOUS | Status: DC
  Administered 2014-12-02: 21:00:00 via INTRAVENOUS

## 2014-12-02 MED ORDER — IOHEXOL 300 MG/ML  SOLN
300.0000 mL | Freq: Once | INTRAMUSCULAR | Status: DC | PRN
  Administered 2014-12-02: 130 mL via INTRA_ARTERIAL
  Filled 2014-12-02: qty 300

## 2014-12-02 MED ORDER — FENTANYL CITRATE (PF) 100 MCG/2ML IJ SOLN
INTRAMUSCULAR | Status: AC
  Filled 2014-12-02: qty 4

## 2014-12-02 MED ORDER — FENTANYL CITRATE (PF) 100 MCG/2ML IJ SOLN
INTRAMUSCULAR | Status: AC | PRN
  Administered 2014-12-02: 50 ug via INTRAVENOUS

## 2014-12-02 MED ORDER — MIDAZOLAM HCL 2 MG/2ML IJ SOLN
INTRAMUSCULAR | Status: AC | PRN
  Administered 2014-12-02: 1 mg via INTRAVENOUS

## 2014-12-02 MED ORDER — LIDOCAINE HCL 1 % IJ SOLN
INTRAMUSCULAR | Status: AC
  Filled 2014-12-02: qty 20

## 2014-12-02 NOTE — Sedation Documentation (Addendum)
IR holding pressure to R groin. Bedrest will be 6 hrs. 1 venous stick, 1 arterial stick to R groin

## 2014-12-02 NOTE — Procedures (Signed)
Interventional Radiology Procedure Note  Procedure: US guided right CFA & CFV access.  Thoracic/descending aortic angiogram.  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  Selective angiogram celiac artery.  Left PA angiogram.  Manual pressure.    Complications: None Recommendations:  - Ok to shower tomorrow - Do not submerge for 7 days - Routine care   Signed,  Yvone Neu. Loreta Ave, DO

## 2014-12-02 NOTE — Progress Notes (Signed)
Patient ID: Henry Guerrero, male   DOB: 03/18/1976, 39 y.o.   MRN: [id number] [facility] DAILY ICU PROGRESS NOTE                   [address]            [place] [address]          [phone]        Total Length of Stay:  LOS: 3 days   Subjective: Sleepy after arteriogram  Objective: Vital signs in last 24 hours: Temp:  [97.9 F (36.6 C)-98.5 F (36.9 C)] 98.2 F (36.8 C) (08/15 1643) Pulse Rate:  [60-97] 64 (08/15 1158) Cardiac Rhythm:  [-] Normal sinus rhythm (08/15 1158) Resp:  [15-25] 18 (08/15 1158) BP: (115-129)/(75-84) 115/84 mmHg (08/15 1158) SpO2:  [93 %-99 %] 93 % (08/15 1158)  Filed Weights   11/29/14 0537  Weight: 162 lb (73.483 kg)    Weight change:    Hemodynamic parameters for last 24 hours:    Intake/Output from previous day: 08/14 0701 - 08/15 0700 In: 1780 [P.O.:480; I.V.:1300] Out: 1900 [Urine:1900]  Intake/Output this shift: Total I/O In: 740 [P.O.:240; I.V.:500] Out: -   Current Meds: Scheduled Meds: . cetirizine  5 mg Oral Daily  . fentaNYL      . lidocaine      . midazolam      . pantoprazole  40 mg Oral BID  . sodium chloride  3 mL Intravenous Q12H   Continuous Infusions: . dextrose 5 % and 0.9% NaCl 100 mL/hr at 12/01/14 2242   PRN Meds:.acetaminophen **OR** acetaminophen, alum & mag hydroxide-simeth, iohexol, ondansetron **OR** ondansetron (ZOFRAN) IV  General appearance: alert and cooperative Neurologic: intact Heart: regular rate and rhythm, S1, S2 normal, no murmur, click, rub or gallop Lungs: clear to auscultation bilaterally Abdomen: soft, non-tender; bowel sounds normal; no masses,  no organomegaly Extremities: extremities normal, atraumatic, no cyanosis or edema and Homans sign is negative, no sign of DVT bandage rt groin  Lab Results: CBC: Recent Labs  11/30/14 1035 12/02/14 0235  WBC 6.3 7.6  HGB 14.1 14.6  HCT 42.5 43.5  PLT 230 230   BMET:  Recent Labs  11/30/14 1035 12/02/14 0235    NA 138 142  K 3.5 3.6  CL 106 109  CO2 24 25  GLUCOSE 106* 97  BUN 6 6  CREATININE 0.80 0.72  CALCIUM 8.8* 8.9    PT/INR:  Recent Labs  12/02/14 0235  LABPROT 13.7  INR 1.03   Radiology: No results found.   Dg Chest 2 View  11/18/2014   CLINICAL DATA:  Bloody sputum.  Cough for 4 days.  Nonsmoker.  EXAM: CHEST  2 VIEW  COMPARISON:  None.  FINDINGS: Heart size is normal. Lungs are free of focal consolidations. No pleural effusions. There is subsegmental atelectasis in the left lung base.  IMPRESSION: Left lower lobe atelectasis.   Electronically Signed   By: Norva Pavlov M.D.   On: 11/18/2014 15:03   Ct Angio Chest Pe W/cm &/or Wo Cm  11/29/2014   CLINICAL DATA:  39 year old with hemoptysis for 3 weeks. Been treated with antibiotics.  EXAM: CT ANGIOGRAPHY CHEST WITH CONTRAST  TECHNIQUE: Multidetector CT imaging of the chest was performed using the standard protocol during bolus administration of intravenous contrast. Multiplanar CT image reconstructions and MIPs were obtained to evaluate the vascular anatomy.  CONTRAST:  OMNIPAQUE IOHEXOL 350 MG/ML SOLN  COMPARISON:  None.  FINDINGS: Negative for pulmonary embolism.  No gross abnormality to the thyroid tissue. There is no significant chest lymphadenopathy. No significant pericardial or pleural fluid. Images of the upper abdomen are unremarkable.  There is an enlarged artery originating off the anterior distal descending thoracic aorta on sequence 12, image 162. This vessel extends into the left lower lobe and compatible with an enlarged bronchial artery. There are normal pulmonary arteries in the left lower lobe. Difficult to exclude an arteriovenous fistula. There is typical pulmonary venous drainage on the left side with a superior and inferior pulmonary trunk.  Trachea and mainstem bronchi are patent. There are patchy parenchymal densities throughout the left lower lobe. This could represent some parenchymal hemorrhage based on  the history of hemoptysis. There is mild volume loss in left lower lobe. Patchy densities in right lower lobe may represent atelectasis.  No acute bone abnormality.  Review of the MIP images confirms the above findings.  IMPRESSION: Negative for pulmonary embolism.  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 There are patchy ground-glass densities in the left lower lobe which could represent a small amount of parenchymal hemorrhage along with atelectasis. The pulmonary venous drainage in the left lung appears to be grossly normal. There is no clear evidence for an arteriovenous malformation or fistula but difficult to exclude. No evidence to suggest a pulmonary sequestration.   Electronically Signed   By: Richarda Overlie M.D.   On: 11/29/2014 08:07     Assessment/Plan: Arteriogram reviewed with patient and wife and IR Dr Lowella Dandy and Germaine Pomfret.. Patient was sleepy and distracted will need to review again. Will have Dr Fredia Sorrow review tomorrow and make final decision about consider coiling vessel vs surgical resection of left lower lobe. 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    [name] 12/02/2014 5:34 PM

## 2014-12-02 NOTE — Progress Notes (Signed)
[place] TEAM 1 - Stepdown/ICU TEAM Progress Note  Henry Guerrero WUJ:[id number] DOB: [birth year] DOA: 11/29/2014 PCP: Emeterio Reeve, MD  Admit HPI / Brief Narrative: 39 yo M w/ no signif med hx who 3 weeks prior to this admit noted the first of many episodes of hemoptysis.  He describes the episodes as beginning with a tickling in the back of his throat followed by coughing up a teaspoon of red blood. He's had no fever or chills. He's been treated for sinusitis and pneumonia. He was scheduled to have a CT as an outpt, but because of recurrent hemoptysis he sought medical attention at the [facility] ED. CT scan of the chest was performed. He was transferred to [facility] to be seen by [facility].  HPI/Subjective: Chart reviewed in its entirety.  Labs/Xrays reviewed.  MD notes reviewed.  No exam today.  All active issue being addressed by TCTS today.    Assessment/Plan:  Pulmonary hemorrhage -Definitive treatment per TCTS - may need surgical intervention or a coiling procedure for embolization -Arteriogram accomplished today   Code Status: FULL Family Communication: Family present at time of exam Disposition Plan: Per cardiothoracic surgery  Consultants: Dr. [name] (TCTS) Dr. 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 -Patchy ground-glass densities LLL could represent small amount of parenchymal hemorrhage along with atelectasis. -pulmonary venous drainage left lung grossly normal.  -no evidence AVM or fistula but difficult to exclude.  -No evidence to suggest pulmonary sequestration. 8/15 arteriogram - results pending   Culture NA  Antibiotics: NA  DVT prophylaxis: SCDs  Objective: Blood pressure 115/84, pulse 64, temperature 98.2 F (36.8 C), temperature source Oral, resp. rate 18, height 5\' 8"  (1.727 m), weight  73.483 kg (162 lb), SpO2 93 %.  Intake/Output Summary (Last 24 hours) at 12/02/14 1737 Last data filed at 12/02/14 1300  Gross per 24 hour  Intake   2160 ml  Output   1100 ml  Net   1060 ml   Exam: No exam indicated today    Data Reviewed: Basic Metabolic Panel:  Recent Labs Lab 11/29/14 0630 11/29/14 2207 11/30/14 1035 12/02/14 0235  NA 140  --  138 142  K 3.2*  --  3.5 3.6  CL 103  --  106 109  CO2  --   --  24 25  GLUCOSE 103*  --  106* 97  BUN 9  --  6 6  CREATININE 0.80  --  0.80 0.72  CALCIUM  --   --  8.8* 8.9  MG  --  2.0 1.8  --    Liver Function Tests:  Recent Labs Lab 11/29/14 0947 11/30/14 1035  AST 48* 37  ALT 113* 83*  ALKPHOS 55 47  BILITOT 0.7 1.1  PROT 7.5 6.0*  ALBUMIN 4.2 3.5   CBC:  Recent Labs Lab 11/29/14 0615 11/29/14 0630 11/30/14 1035 12/02/14 0235  WBC 10.2  --  6.3 7.6  NEUTROABS 6.9  --  3.6  --   HGB 15.6 17.0 14.1 14.6  HCT 45.6 50.0 42.5 43.5  MCV 86.0  --  86.7 87.3  PLT 251  --  230 230    Recent Results (from the past 240 hour(s))  MRSA PCR Screening     Status: None   Collection Time: 11/29/14  3:22 PM  Result Value Ref Range Status  MRSA by PCR NEGATIVE NEGATIVE Final    Comment:        The GeneXpert MRSA Assay (FDA approved for NASAL specimens only), is one component of a comprehensive MRSA colonization surveillance program. It is not intended to diagnose MRSA infection nor to guide or monitor treatment for MRSA infections.      Studies:  Recent x-ray studies have been reviewed in detail by the Attending Physician  Scheduled Meds:  Scheduled Meds: . cetirizine  5 mg Oral Daily  . fentaNYL      . lidocaine      . midazolam      . pantoprazole  40 mg Oral BID  . sodium chloride  3 mL Intravenous Q12H    Time spent on care of this patient: no charge  Lonia Blood, MD [facility] For Consults/Admissions - Flow Manager [phone] Office  [phone]  Contact MD  directly via text page:      [email or web address]      password [email or web address]  12/02/2014, 5:37 PM   LOS: 3 days

## 2014-12-02 NOTE — Sedation Documentation (Signed)
Pedal pulses: DP 3+; PT 2+ bilaterally

## 2014-12-03 LAB — CBC
HEMATOCRIT: 42.7 % (ref 39.0–52.0)
HEMOGLOBIN: 14.5 g/dL (ref 13.0–17.0)
MCH: 29.4 pg (ref 26.0–34.0)
MCHC: 34 g/dL (ref 30.0–36.0)
MCV: 86.6 fL (ref 78.0–100.0)
Platelets: 223 10*3/uL (ref 150–400)
RBC: 4.93 MIL/uL (ref 4.22–5.81)
RDW: 13.4 % (ref 11.5–15.5)
WBC: 7.6 10*3/uL (ref 4.0–10.5)

## 2014-12-03 NOTE — Progress Notes (Signed)
[place] TEAM 1 - Stepdown/ICU TEAM Progress Note  Philo Kurtz ZOX:[id number] DOB: [birth year] DOA: 11/29/2014 PCP: Emeterio Reeve, MD  Admit HPI / Brief Narrative: 39 yo HM no previous pulmonary history. 3 weeks ago he noted the beginning of episodes of hemoptysis associated with a tickling in the back of his throat and cough up proximally a teaspoon of red blood. He's had no fever or chills. He's been treated for sinusitis and pneumonia. He was scheduled to have a CT scan this afternoon. Because of recurrent hemoptysis this morning he sought medical attention at the [facility] emergency room. CT scan of the chest was performed. Since admission the patient's had no further hemoptysis, but he was transferred to [facility] for observation and further evaluation of his CT abnormalities.  HPI/Subjective: 8/16 A/O 4, NAD. States positive hemoptysis overnight, negative SOB, negative CP.   Assessment/Plan: Pulmonary hemorrhage - EDP discussed with Dr. Tyrone Sage of [facility] who recommended transfer to [facility] SDU. -Per Dr.Edward Bari Edward (cardiothoracic surgery) will plan to proceed with left lung resection / likely lower lobectomy.     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 -Patchy ground-glass densities LLL could represent  small amount of parenchymal hemorrhage along with atelectasis. -pulmonary venous drainage left lung grossly normal.  -no evidence AVM or fistula but difficult to exclude.  -No evidence to suggest pulmonary sequestration.   Culture NA  Antibiotics: NA  DVT prophylaxis: SCD   Devices NA   LINES / TUBES:        Continuous Infusions: . sodium chloride 50 mL/hr at 12/02/14 2105    Objective: VITAL SIGNS: Temp: 98.8 F (37.1 C) (08/16 2000) Temp Source: Oral (08/16 2000) BP: 116/75 mmHg (08/16 2000) Pulse Rate: 87 (08/16 2000) SPO2; FIO2:   Intake/Output Summary (Last 24 hours) at 12/03/14 2127 Last data filed at 12/03/14 1300  Gross per 24 hour  Intake 795.83 ml  Output   1300 ml  Net -504.17 ml     Exam: General: A/O 4, NAD. States negative hemoptysis overnight, No acute respiratory distress Eyes: Negative headache, eye pain, double vision,negative scleral hemorrhage ENT: Negative Runny nose, negative ear pain, negative tinnitus, negative gingival bleeding, Neck:  Negative scars, masses, torticollis, lymphadenopathy, JVD Lungs: Clear to auscultation bilaterally without wheezes or crackles Cardiovascular: Regular rate and rhythm without murmur gallop or rub normal S1 and S2 Abdomen:negative abdominal pain, negative dysphagia, nondistended, positive soft, bowel sounds, no rebound, no ascites, no appreciable mass Extremities: No significant cyanosis, clubbing, or edema bilateral lower extremities Psychiatric:  Negative depression, negative anxiety, negative fatigue, negative mania  Neurologic:  Cranial nerves II through XII intact, tongue/uvula midline, all extremities muscle strength 5/5, sensation intact throughout, negative dysarthria, negative expressive aphasia, negative receptive aphasia.      Data Reviewed: Basic Metabolic Panel:  Recent Labs Lab 11/29/14 0630 11/29/14 2207 11/30/14 1035 12/02/14 0235  NA 140  --  138 142  K 3.2*  --  3.5 3.6  CL 103  --  106 109  CO2  --   --  24 25  GLUCOSE 103*  --  106* 97  BUN 9  --  6 6  CREATININE 0.80  --  0.80 0.72  CALCIUM  --   --  8.8* 8.9  MG  --  2.0 1.8  --    Liver Function Tests:  Recent Labs Lab 11/29/14 0947 11/30/14 1035  AST 48* 37  ALT 113* 83*  ALKPHOS 55 47  BILITOT 0.7 1.1  PROT 7.5  6.0*  ALBUMIN 4.2 3.5   No results for input(s): LIPASE, AMYLASE in the last 168 hours. No results for input(s): AMMONIA in the last 168 hours. CBC:  Recent Labs Lab 11/29/14 0615 11/29/14 0630 11/30/14 1035 12/02/14 0235 12/03/14 0036  WBC 10.2  --  6.3 7.6 7.6  NEUTROABS 6.9  --  3.6  --   --   HGB 15.6 17.0 14.1 14.6 14.5  HCT 45.6 50.0 42.5 43.5 42.7  MCV 86.0  --  86.7 87.3 86.6  PLT 251  --  230 230 223   Cardiac Enzymes: No results for input(s): CKTOTAL, CKMB, CKMBINDEX, TROPONINI in the last 168 hours. BNP (last 3 results) No results for input(s): BNP in the last 8760 hours.  ProBNP (last 3 results) No results for input(s): PROBNP in the last 8760 hours.  CBG: No results for input(s): GLUCAP in the last 168 hours.  Recent Results (from the past 240 hour(s))  MRSA PCR Screening     Status: None   Collection Time: 11/29/14  3:22 PM  Result Value Ref Range Status   MRSA by PCR NEGATIVE NEGATIVE Final    Comment:        The GeneXpert MRSA Assay (FDA approved for NASAL specimens only), is one component of a comprehensive MRSA colonization surveillance program. It is not intended to diagnose MRSA infection nor to guide or monitor treatment for MRSA infections.      Studies:  Recent x-ray studies have been reviewed in detail by the Attending Physician  Scheduled Meds:  Scheduled Meds: . cetirizine  5 mg Oral Daily  . pantoprazole  40 mg Oral BID  . sodium chloride  3 mL Intravenous Q12H    Time spent on care of this patient: 40 mins   WOODS, Roselind Messier , MD  [facility] Office  [phone] Pager - [phone]  On-Call/Text Page:      [email or web address]      password [email or web address]  If 7PM-7AM, please contact night-coverage [email or web address] Password [email or web address] 12/03/2014, 9:27 PM   LOS: 4 days   Care during the described time interval was provided by me .  I have reviewed this patient's available data, including medical history, events of note, physical  examination, and all test results as part of my evaluation. I have personally reviewed and interpreted all radiology studies.   Carolyne Littles, MD [phone] Pager

## 2014-12-03 NOTE — Progress Notes (Signed)
Patient ID: Henry Guerrero, male   DOB: Feb 18, 1976, 39 y.o.   MRN: [id number] Patient ID: Henry Guerrero, male   DOB: [birth year], 39 y.o.   MRN: [id number] [facility] DAILY ICU PROGRESS NOTE                   [address]            [name] [address]          [phone]     Procedure(s) (LRB): VIDEO BRONCHOSCOPY (N/A) VIDEO ASSISTED THORACOSCOPY (VATS)/LEFT LOWER LOBECTOMY (Left)  Total Length of Stay:  LOS: 4 days   Subjective: Sleepy after arteriogram  Objective: Vital signs in last 24 hours: Temp:  [97.4 F (36.3 C)-98.3 F (36.8 C)] 97.8 F (36.6 C) (08/16 1705) Pulse Rate:  [69-93] 87 (08/16 1705) Cardiac Rhythm:  [-] Normal sinus rhythm (08/16 1705) Resp:  [19-21] 21 (08/16 1705) BP: (111-124)/(72-88) 124/82 mmHg (08/16 1705) SpO2:  [95 %-98 %] 95 % (08/16 1705)  Filed Weights   11/29/14 0537  Weight: 162 lb (73.483 kg)    Weight change:    Hemodynamic parameters for last 24 hours:    Intake/Output from previous day: 08/15 0701 - 08/16 0700 In: 1735.8 [P.O.:240; I.V.:1495.8] Out: 500 [Urine:500]  Intake/Output this shift:    Current Meds: Scheduled Meds: . cetirizine  5 mg Oral Daily  . pantoprazole  40 mg Oral BID  . sodium chloride  3 mL Intravenous Q12H   Continuous Infusions: . sodium chloride 50 mL/hr at 12/02/14 2105   PRN Meds:.acetaminophen **OR** acetaminophen, alum & mag hydroxide-simeth, iohexol, ondansetron **OR** ondansetron (ZOFRAN) IV  General appearance: alert and cooperative Neurologic: intact Heart: regular rate and rhythm, S1, S2 normal, no murmur, click, rub or gallop Lungs: clear to auscultation bilaterally Abdomen: soft, non-tender; bowel sounds normal; no masses,  no organomegaly Extremities: extremities normal, atraumatic, no cyanosis or edema and Homans sign is negative, no sign of DVT bandage rt groin  Lab Results: CBC:  Recent Labs  12/02/14 0235 12/03/14 0036  WBC 7.6 7.6  HGB 14.6 14.5  HCT 43.5  42.7  PLT 230 223   BMET:   Recent Labs  12/02/14 0235  NA 142  K 3.6  CL 109  CO2 25  GLUCOSE 97  BUN 6  CREATININE 0.72  CALCIUM 8.9    PT/INR:   Recent Labs  12/02/14 0235  LABPROT 13.7  INR 1.03   Radiology:Ir Aorta/thoracic  12/02/2014   INDICATION: 39 year old gentleman with a history of hemoptysis for 3 weeks to 4 weeks, found on cross-sectional imaging to have 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 ULTRASOUND GUIDED ACCESS OF RIGHT COMMON FEMORAL ARTERY 2. DESCENDING THORACIC AORTIC ANGIOGRAM 3. SELECTIVE ANGIOGRAM OF ABERRANT ARTERY TO THE LEFT LOWER LOBE 4. SELECTIVE ANGIOGRAM OF THE CELIAC ARTERY 5. ULTRASOUND GUIDED ACCESS OF RIGHT COMMON FEMORAL VEIN 6. LEFT PULMONARY ARTERY ANGIOGRAM  COMPARISON:  CT imaging 12/01/2014  MEDICATIONS: Versed 1.0 mg IV; Fentanyl 50 mcg IV  CONTRAST:  130 cc contrast  ANESTHESIA/SEDATION: Thirty minutes  FLUOROSCOPY TIME:  Four minutes.  Zero seconds.  ACCESS: Right common femoral artery; hemostasis achieved with manual compression.  Right common femoral vein, with hemostasis achieved with manual compression.  COMPLICATIONS: None  TECHNIQUE: Informed written consent was obtained from the  patient after a discussion of the risks, benefits and alternatives to treatment. Questions regarding the procedure were encouraged and answered. A timeout was performed prior to the initiation of the procedure.  The right groin was prepped and draped in the usual sterile fashion, and a sterile drape was applied covering the operative field. Maximum barrier sterile technique with sterile gowns and gloves were used for the procedure. A timeout was performed prior to the initiation of the procedure. Local anesthesia was provided with 1% lidocaine.  Ultrasound survey of the  right inguinal region was performed with images stored and sent to PACs.  A micropuncture needle was used access the right common femoral artery under ultrasound. With excellent arterial blood flow returned, an 018 micro wire was passed through the needle, observed enter the abdominal aorta under fluoroscopy. The needle was removed, and a micropuncture sheath was placed over the wire. The inner dilator and wire were removed, and an 035 Bentson wire was advanced under fluoroscopy into the abdominal aorta. The sheath was removed and a standard 5 [place] vascular sheath was placed. The dilator was removed and the sheath was flushed.  A standard 5 French pigtail catheter was then advanced over the Bentson wire into the distal aortic arch, just beyond the takeoff the left subclavian artery.  Descending thoracic aortic angiogram was then performed.  Over a Bentson wire, the pigtail catheter was exchanged for a 5 [place] cobra catheter, for selection of aberrant artery to the left lower lobe. Selective angiogram was performed.  The Cobra catheter was withdrawn to the abdomen, and a selective angiogram of the celiac artery was performed. Catheter was then removed.  Ultrasound guided access of the right common femoral vein was then performed under ultrasound guidance. A single wall 19 gauge needle was advanced with ultrasound guidance into the vein, with venous blood flow returned. A Bentson wire was passed under fluoroscopy, observed enter the IVC. The needle was removed and a standard 7 [place] vascular sheath was placed. The dilator was removed and the sheath was flushed.  An angled pigtail catheter was then advanced over a Bentson wire into the right heart, with navigation into the left pulmonary artery.  Left pulmonary artery angiogram was then performed.  Catheters and wires were removed. Manual pressure was used for hemostasis at the right inguinal region on the artery and vein.  Patient tolerated the procedure well and  remained hemodynamically stable throughout.  No complications were encountered and no significant blood loss was encountered.  FINDINGS: Descending thoracic aortic angiogram:  Normal course caliber and contour of the descending thoracic aorta. No evidence of dissection or aneurysm. No significant atherosclerotic changes. Bilateral segmental arteries fill within normal limits.  Partial visualization of the left internal mammary artery, within normal limits. Supreme intercostal artery identified, as well as the usual branch pattern of the right bronchial arteries.  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 This artery contributes to supply of the medial segment of the left lower lobe. No rapid right to left shunting, with a usual/expected parenchymal opacification of lung tissue, with venous drainage via the inferior pulmonary veins. No systemic venous drainage identified.  Selective angiogram of aberrant artery to the left lower lobe:  Selective angiogram of the amber an artery again demonstrates usual parenchymal blush of lung parenchyma, with venous drainage through the left lower pulmonary veins. No systemic drainage identified. No portal drainage identified.  Selective injection identifies no radicular-medullary arteries, with no visualization of spinal artery  anatomy.  Celiac artery angiogram:  Selective celiac artery injection demonstrates normal configuration of the splenic artery, left gastric artery, and common hepatic artery. Gastroduodenal artery identified, as well as both right and left hepatic arteries from the common hepatic artery. No contribution to the lung parenchyma from the celiac artery identified.  Left pulmonary artery angiogram:  Left pulmonary artery angiogram demonstrates usual branch pattern, with no attenuated branches identified within the left lower lobe. Parenchymal enhancement within normal limits.  In the comparison of the pulmonary artery  angiogram and the aberrant systemic arterial angiogram, the majority of the lung tissue that is perfused appears to be generally perfused by both territories (dual blood supply).  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 It is difficult to determine whether there is a portion of the lung parenchyma perfused only by the systemic arterial supply.  Signed,  Yvone Neu. Loreta Ave DO  [facility]  [facility]  PLAN: Right leg straight for 6 hours given the right common femoral artery access.  Gentle IV hydration.  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   Electronically Signed   By: Gilmer Mor D.O.   On: 12/02/2014 17:42    IDg Chest 2 View  11/18/2014   CLINICAL DATA:  Bloody sputum.  Cough for 4 days.  Nonsmoker.  EXAM: CHEST  2 VIEW  COMPARISON:  None.  FINDINGS: Heart size is normal. Lungs are free of focal consolidations. No pleural effusions. There is subsegmental atelectasis in the left lung base.  IMPRESSION: Left lower lobe atelectasis.   Electronically Signed   By: Norva Pavlov M.D.   On: 11/18/2014 15:03   Ct Angio Chest Pe W/cm &/or Wo Cm  11/29/2014   CLINICAL DATA:  39 year old with hemoptysis for 3 weeks. Been treated with antibiotics.  EXAM: CT ANGIOGRAPHY CHEST WITH CONTRAST  TECHNIQUE: Multidetector CT imaging of the chest was performed using the standard protocol during bolus administration of intravenous contrast. Multiplanar CT image reconstructions and MIPs were obtained to  evaluate the vascular anatomy.  CONTRAST:  OMNIPAQUE IOHEXOL 350 MG/ML SOLN  COMPARISON:  None.  FINDINGS: Negative for pulmonary embolism.  No gross abnormality to the thyroid tissue. There is no significant chest lymphadenopathy. No significant pericardial or pleural fluid. Images of the upper abdomen are unremarkable.  There is an enlarged artery originating off the anterior distal descending thoracic aorta on sequence 12, image 162. This vessel extends into the left lower lobe and compatible with an enlarged bronchial artery. There are normal pulmonary arteries in the left lower lobe. Difficult to exclude an arteriovenous fistula. There is typical pulmonary venous drainage on the left side with a superior and inferior pulmonary trunk.  Trachea and mainstem bronchi are patent. There are patchy parenchymal densities throughout the left lower lobe. This could represent some parenchymal hemorrhage based on the history of hemoptysis. There is mild volume loss in left lower lobe. Patchy densities in right lower lobe may represent atelectasis.  No acute bone abnormality.  Review of the MIP images confirms the above findings.  IMPRESSION: Negative for pulmonary embolism.  Enlarged bronchial artery originating from the  descending thoracic aorta that extends into the left lower lobe. There are patchy ground-glass densities in the left lower lobe which could represent a small amount of parenchymal hemorrhage along with atelectasis. The pulmonary venous drainage in the left lung appears to be grossly normal. There is no clear evidence for an arteriovenous malformation or fistula but difficult to exclude. No evidence to suggest a pulmonary sequestration.   Electronically Signed   By: Richarda Overlie M.D.   On: 11/29/2014 08:07     Assessment/Plan: Arteriogram reviewed with patient and wife and IR Dr Lowella Dandy and Germaine Pomfret. Discussed with Dr Fredia Sorrow. With discussion with radiology and concern for poss pulmonary infarct with  embolization/coiling will plan to proceed with left lung resection / likely lower lobectomy.  Will Plan for friday  [name] 12/03/2014 7:02 PM

## 2014-12-04 NOTE — Progress Notes (Signed)
[place] TEAM 1 - Stepdown/ICU TEAM Progress Note  Kyen Taite WJX:[id number] DOB: 1975/08/31 DOA: 11/29/2014 PCP: Emeterio Reeve, MD  Admit HPI / Brief Narrative: 39 yo M w/ no signif med hx who 3 weeks prior to this admit noted the first of many episodes of hemoptysis.  He described the episodes as beginning with a tickling in the back of his throat followed by coughing up a teaspoon of red blood. He had no fever or chills. He'd been treated for sinusitis and pneumonia w/o change in his hemoptysis. He was scheduled to have a CT as an outpt, but because of recurrent hemoptysis he sought medical attention at the [facility] ED. CT scan of the chest was performed. He was transferred to [facility] to be seen by [facility].  HPI/Subjective: The patient is resting comfortably in bed.  He denies any episodes of hemoptysis today.  He denies chest pain shortness of breath fevers or chills.  Assessment/Plan:  Pulmonary hemorrhage -Definitive treatment per TCTS - clinically stable at the present time - coags confirmed to be normal  Code Status: FULL Family Communication: Spoke with father at bedside Disposition Plan: Per TCTS  Consultants: TCTS PCCM  Procedures: 8/12 CT angiogram chest PE protocol Negative PE -Enlarged bronchial artery originating from the descending thoracic aorta that extends into the left lower lobe. -Patchy ground-glass densities LLL could represent small amount of parenchymal hemorrhage along with atelectasis. -pulmonary venous drainage left lung grossly normal.  -no evidence AVM or fistula but difficult to exclude.  -No evidence to suggest pulmonary sequestration. 8/15 arteriogram   Culture NA  Antibiotics: NA  DVT prophylaxis: SCDs  Objective: Blood pressure 117/78, pulse 68, temperature 98 F (36.7 C), temperature source Oral, resp. rate 23, height 5\' 8"  (1.727 m), weight 73.483 kg (162 lb), SpO2 97 %.  Intake/Output Summary (Last 24 hours) at 12/04/14  1648 Last data filed at 12/04/14 1300  Gross per 24 hour  Intake   1140 ml  Output    600 ml  Net    540 ml   Exam: General: No acute respiratory distress Lungs: Clear to auscultation bilaterally without wheezes or crackles Cardiovascular: Regular rate and rhythm without murmur gallop or rub normal S1 and S2   Data Reviewed: Basic Metabolic Panel:  Recent Labs Lab 11/29/14 0630 11/29/14 2207 11/30/14 1035 12/02/14 0235  NA 140  --  138 142  K 3.2*  --  3.5 3.6  CL 103  --  106 109  CO2  --   --  24 25  GLUCOSE 103*  --  106* 97  BUN 9  --  6 6  CREATININE 0.80  --  0.80 0.72  CALCIUM  --   --  8.8* 8.9  MG  --  2.0 1.8  --    Liver Function Tests:  Recent Labs Lab 11/29/14 0947 11/30/14 1035  AST 48* 37  ALT 113* 83*  ALKPHOS 55 47  BILITOT 0.7 1.1  PROT 7.5 6.0*  ALBUMIN 4.2 3.5   CBC:  Recent Labs Lab 11/29/14 0615 11/29/14 0630 11/30/14 1035 12/02/14 0235 12/03/14 0036  WBC 10.2  --  6.3 7.6 7.6  NEUTROABS 6.9  --  3.6  --   --   HGB 15.6 17.0 14.1 14.6 14.5  HCT 45.6 50.0 42.5 43.5 42.7  MCV 86.0  --  86.7 87.3 86.6  PLT 251  --  230 230 223    Recent Results (from the past 240 hour(s))  MRSA PCR Screening     Status: None   Collection Time: 11/29/14  3:22 PM  Result Value Ref Range Status   MRSA by PCR NEGATIVE NEGATIVE Final    Comment:        The GeneXpert MRSA Assay (FDA approved for NASAL specimens only), is one component of a comprehensive MRSA colonization surveillance program. It is not intended to diagnose MRSA infection nor to guide or monitor treatment for MRSA infections.      Studies:  Recent x-ray studies have been reviewed in detail by the Attending Physician  Scheduled Meds:  Scheduled Meds: . cetirizine  5 mg Oral Daily  . pantoprazole  40 mg Oral BID  . sodium chloride  3 mL Intravenous Q12H    Time spent on care of this patient: 15 mins  Lonia Blood, MD [facility]  Consults/Admissions - Flow Manager - [phone] Office  [phone]  Contact MD directly via text page:      [email or web address]      password [facility]  12/04/2014, 4:48 PM   LOS: 5 days

## 2014-12-05 ENCOUNTER — Inpatient Hospital Stay (HOSPITAL_COMMUNITY): Payer: 59

## 2014-12-05 DIAGNOSIS — R042 Hemoptysis: Secondary | ICD-10-CM

## 2014-12-05 LAB — BLOOD GAS, ARTERIAL
Acid-base deficit: 0.8 mmol/L (ref 0.0–2.0)
Bicarbonate: 23.1 mEq/L (ref 20.0–24.0)
Drawn by: 313941
FIO2: 0.21
O2 Saturation: 97.2 %
Patient temperature: 98.6
TCO2: 24.2 mmol/L (ref 0–100)
pCO2 arterial: 36.4 mmHg (ref 35.0–45.0)
pH, Arterial: 7.418 (ref 7.350–7.450)
pO2, Arterial: 87.5 mmHg (ref 80.0–100.0)

## 2014-12-05 LAB — CBC
HCT: 46.3 % (ref 39.0–52.0)
Hemoglobin: 15.7 g/dL (ref 13.0–17.0)
MCH: 29.3 pg (ref 26.0–34.0)
MCHC: 33.9 g/dL (ref 30.0–36.0)
MCV: 86.5 fL (ref 78.0–100.0)
Platelets: 243 10*3/uL (ref 150–400)
RBC: 5.35 MIL/uL (ref 4.22–5.81)
RDW: 13.4 % (ref 11.5–15.5)
WBC: 7.1 10*3/uL (ref 4.0–10.5)

## 2014-12-05 LAB — COMPREHENSIVE METABOLIC PANEL
ALT: 151 U/L — ABNORMAL HIGH (ref 17–63)
AST: 67 U/L — ABNORMAL HIGH (ref 15–41)
Albumin: 4 g/dL (ref 3.5–5.0)
Alkaline Phosphatase: 62 U/L (ref 38–126)
Anion gap: 9 (ref 5–15)
BUN: 12 mg/dL (ref 6–20)
CO2: 24 mmol/L (ref 22–32)
Calcium: 9.2 mg/dL (ref 8.9–10.3)
Chloride: 103 mmol/L (ref 101–111)
Creatinine, Ser: 0.96 mg/dL (ref 0.61–1.24)
GFR calc Af Amer: >60 mL/min (ref >60)
GFR calc non Af Amer: >60 mL/min (ref >60)
Glucose, Bld: 147 mg/dL — ABNORMAL HIGH (ref 65–99)
Potassium: 3.8 mmol/L (ref 3.5–5.1)
Sodium: 136 mmol/L (ref 135–145)
Total Bilirubin: 0.4 mg/dL (ref 0.3–1.2)
Total Protein: 7 g/dL (ref 6.5–8.1)

## 2014-12-05 LAB — URINALYSIS, ROUTINE W REFLEX MICROSCOPIC
Bilirubin Urine: NEGATIVE
Glucose, UA: NEGATIVE mg/dL
Ketones, ur: NEGATIVE mg/dL
Leukocytes, UA: NEGATIVE
Nitrite: NEGATIVE
Protein, ur: NEGATIVE mg/dL
Specific Gravity, Urine: 1.012 (ref 1.005–1.030)
Urobilinogen, UA: 0.2 mg/dL (ref 0.0–1.0)
pH: 6 (ref 5.0–8.0)

## 2014-12-05 LAB — PROTIME-INR
INR: 0.98 (ref 0.00–1.49)
Prothrombin Time: 13.2 seconds (ref 11.6–15.2)

## 2014-12-05 LAB — APTT: aPTT: 34 seconds (ref 24–37)

## 2014-12-05 LAB — ABO/RH: ABO/RH(D): O POS

## 2014-12-05 MED ORDER — CHLORHEXIDINE GLUCONATE 4 % EX LIQD
Freq: Once | CUTANEOUS | Status: AC
  Administered 2014-12-05: 1 via TOPICAL
  Filled 2014-12-05: qty 15

## 2014-12-05 MED ORDER — CEFUROXIME SODIUM 1.5 G IJ SOLR
1.5000 g | INTRAMUSCULAR | Status: AC
  Administered 2014-12-06: .75 g via INTRAVENOUS
  Administered 2014-12-06: 1.5 g via INTRAVENOUS
  Filled 2014-12-05 (×2): qty 1.5

## 2014-12-05 MED ORDER — SODIUM CHLORIDE 0.9 % IV SOLN: INTRAVENOUS | Status: AC

## 2014-12-05 NOTE — Anesthesia Preprocedure Evaluation (Addendum)
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 breath sounds clear to auscultation        Cardiovascular negative cardio ROS  Rhythm:Regular Rate:Normal     Neuro/Psych negative neurological ROS     GI/Hepatic Neg liver ROS, GERD-  Medicated and Controlled,  Endo/Other  negative endocrine ROS  Renal/GU negative Renal ROS     Musculoskeletal   Abdominal   Peds  Hematology negative hematology ROS (+)   Anesthesia Other Findings   Reproductive/Obstetrics                          Anesthesia Physical Anesthesia Plan  ASA: II  Anesthesia Plan: General   Post-op Pain Management:    Induction: Intravenous  Airway Management Planned: Oral ETT and Double Lumen EBT  Additional Equipment: Arterial line, CVP and Ultrasound Guidance Line Placement  Intra-op Plan:   Post-operative Plan: Extubation in OR  Informed Consent: I have reviewed the patients History and Physical, chart, labs and discussed the procedure including the risks, benefits and alternatives for the proposed anesthesia with the patient or authorized representative who has indicated his/her understanding and acceptance.   Dental advisory given  Plan Discussed with: Anesthesiologist, CRNA and Surgeon  Anesthesia Plan Comments: (Plan routine monitors, A line, CVP, GETA )       Anesthesia Quick Evaluation

## 2014-12-05 NOTE — Progress Notes (Signed)
[place] TEAM 1 - Stepdown/ICU TEAM Progress Note  Henry Guerrero RUE:[id number] DOB: 1976/02/13 DOA: 11/29/2014 PCP: Emeterio Reeve, MD  Admit HPI / Brief Narrative: 39 yo HM no previous pulmonary history. 3 weeks ago he noted the beginning of episodes of hemoptysis associated with a tickling in the back of his throat and cough up proximally a teaspoon of red blood. He's had no fever or chills. He's been treated for sinusitis and pneumonia. He was scheduled to have a CT scan this afternoon. Because of recurrent hemoptysis this morning he sought medical attention at the [facility] emergency room. CT scan of the chest was performed. Since admission the patient's had no further hemoptysis, but he was transferred to [facility] for observation and further evaluation of his CT abnormalities.  HPI/Subjective: 8/18 A/O 4, NAD. States positive hemoptysis overnight, negative SOB, negative CP.   Assessment/Plan: Pulmonary hemorrhage - EDP discussed with Dr. Tyrone Sage of CVTS who recommended transfer to [facility] SDU. -Per Dr.Edward B Gerhardt (cardiothoracic surgery) will plan to proceed with left lung resection / likely lower lobectomy in the a.m.     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 -Patchy ground-glass densities LLL could represent  small amount of parenchymal hemorrhage along with atelectasis. -pulmonary venous drainage left lung grossly normal.  -no evidence AVM or fistula but difficult to exclude.  -No evidence to suggest pulmonary sequestration.   Culture NA  Antibiotics: NA  DVT  prophylaxis: SCD   Devices NA   LINES / TUBES:      Continuous Infusions:    Objective: VITAL SIGNS: Temp: 97.9 F (36.6 C) (08/18 0700) Temp Source: Oral (08/18 0700) BP: 111/79 mmHg (08/18 0437) Pulse Rate: 75 (08/18 0437) SPO2; FIO2:   Intake/Output Summary (Last 24 hours) at 12/05/14 0841 Last data filed at 12/04/14 1900  Gross per 24 hour  Intake    780 ml  Output      0 ml  Net    780 ml     Exam: General: A/O 4, NAD. States negative hemoptysis overnight, No acute respiratory distress Eyes: Negative headache, eye pain, double vision,negative scleral hemorrhage ENT: Negative Runny nose, negative ear pain, negative tinnitus, negative gingival bleeding, Neck:  Negative scars, masses, torticollis, lymphadenopathy, JVD Lungs: Clear to auscultation bilaterally without wheezes or crackles Cardiovascular: Regular rate and rhythm without murmur gallop or rub normal S1 and S2 Abdomen:negative abdominal pain, negative dysphagia, nondistended, positive soft, bowel sounds, no rebound, no ascites, no appreciable mass Extremities: No significant cyanosis, clubbing, or edema bilateral lower extremities Psychiatric:  Negative depression, negative anxiety, negative fatigue, negative mania  Neurologic:  Cranial nerves II through XII intact, tongue/uvula midline, all extremities muscle strength 5/5, sensation intact throughout, negative dysarthria, negative expressive aphasia, negative receptive aphasia.      Data Reviewed: Basic Metabolic Panel:  Recent Labs Lab 11/29/14 0630 11/29/14 2207 11/30/14 1035 12/02/14 0235  NA 140  --  138 142  K 3.2*  --  3.5 3.6  CL 103  --  106 109  CO2  --   --  24 25  GLUCOSE 103*  --  106* 97  BUN 9  --  6 6  CREATININE 0.80  --  0.80 0.72  CALCIUM  --   --  8.8* 8.9  MG  --  2.0 1.8  --    Liver Function Tests:  Recent Labs Lab 11/29/14 0947 11/30/14 1035  AST 48* 37  ALT 113* 83*  ALKPHOS 55 47  BILITOT 0.7 1.1   PROT 7.5 6.0*  ALBUMIN 4.2 3.5   No results for input(s): LIPASE, AMYLASE in the last 168 hours. No results for input(s): AMMONIA in the last 168 hours. CBC:  Recent Labs Lab 11/29/14 0615 11/29/14 0630 11/30/14 1035 12/02/14 0235 12/03/14 0036  WBC 10.2  --  6.3 7.6 7.6  NEUTROABS 6.9  --  3.6  --   --   HGB 15.6 17.0 14.1 14.6 14.5  HCT 45.6 50.0 42.5 43.5 42.7  MCV 86.0  --  86.7 87.3 86.6  PLT 251  --  230 230 223   Cardiac Enzymes: No results for input(s): CKTOTAL, CKMB, CKMBINDEX, TROPONINI in the last 168 hours. BNP (last 3 results) No results for input(s): BNP in the last 8760 hours.  ProBNP (last 3 results) No results for input(s): PROBNP in the last 8760 hours.  CBG: No results for input(s): GLUCAP in the last 168 hours.  Recent Results (from the past 240 hour(s))  MRSA PCR Screening     Status: None   Collection Time: 11/29/14  3:22 PM  Result Value Ref Range Status   MRSA by PCR NEGATIVE NEGATIVE Final    Comment:        The GeneXpert MRSA Assay (FDA approved for NASAL specimens only), is one component of a comprehensive MRSA colonization surveillance program. It is not intended to diagnose MRSA infection nor to guide or monitor treatment for MRSA infections.      Studies:  Recent x-ray studies have been reviewed in detail by the Attending Physician  Scheduled Meds:  Scheduled Meds: . cetirizine  5 mg Oral Daily  . pantoprazole  40 mg Oral BID  . sodium chloride  3 mL Intravenous Q12H    Time spent on care of this patient: 40 mins   Carolyn Sylvia, Roselind Messier , MD  [facility] Office  [phone] Pager - [phone]  On-Call/Text Page:      [email or web address]      password [email or web address]  If 7PM-7AM, please contact night-coverage [email or web address] Password [email or web address] 12/05/2014, 8:41 AM   LOS: 6 days   Care during the described time interval was provided by me .  I have reviewed this patient's available data, including medical history, events of note,  physical examination, and all test results as part of my evaluation. I have personally reviewed and interpreted all radiology studies.   Carolyne Littles, MD [phone] Pager

## 2014-12-05 NOTE — Progress Notes (Signed)
Patient ID: Henry Guerrero, male   DOB: 1975/09/14, 39 y.o.   MRN: [id number]      [address]       [place] [address]             [phone]                   Procedure(s) (LRB): VIDEO BRONCHOSCOPY (N/A) VIDEO ASSISTED THORACOSCOPY (VATS)/LEFT LOWER LOBECTOMY (Left)  LOS: 6 days   Subjective: No complaints this am, no hemoptysis this over night -  Objective: Vital signs in last 24 hours: Patient Vitals for the past 24 hrs:  BP Temp Temp src Pulse Resp SpO2  12/05/14 0700 - 97.9 F (36.6 C) Oral - - -  12/05/14 0437 111/79 mmHg 97.9 F (36.6 C) Oral 75 13 100 %  12/05/14 0035 109/81 mmHg 98 F (36.7 C) Oral 67 17 98 %  12/04/14 1950 115/75 mmHg 98.1 F (36.7 C) Oral 79 (!) 21 98 %  12/04/14 1617 - 98 F (36.7 C) Oral - - -  12/04/14 1615 117/78 mmHg - - 68 (!) 23 97 %  12/04/14 1237 - 97.8 F (36.6 C) Oral - - -  12/04/14 1232 114/70 mmHg - - 69 20 98 %    Filed Weights   11/29/14 0537  Weight: 162 lb (73.483 kg)    Hemodynamic parameters for last 24 hours:    Intake/Output from previous day: 08/17 0701 - 08/18 0700 In: 780 [P.O.:780] Out: -  Intake/Output this shift:    Scheduled Meds: . cetirizine  5 mg Oral Daily  . pantoprazole  40 mg Oral BID  . sodium chloride  3 mL Intravenous Q12H   Continuous Infusions:  PRN Meds:.acetaminophen **OR** acetaminophen, alum & mag hydroxide-simeth, iohexol, ondansetron **OR** ondansetron (ZOFRAN) IV  General appearance: alert and cooperative Neurologic: intact Heart: regular rate and rhythm, S1, S2 normal, no murmur, click, rub or gallop Lungs: clear to auscultation bilaterally Abdomen: soft, non-tender; bowel sounds normal; no masses,  no organomegaly Extremities: extremities normal, atraumatic, no cyanosis or edema and Homans sign is negative, no sign of DVT  Lab Results: CBC: Recent Labs  12/03/14 0036  WBC 7.6  HGB 14.5  HCT 42.7  PLT 223   BMET: No results for input(s): NA, K,  CL, CO2, GLUCOSE, BUN, CREATININE, CALCIUM in the last 72 hours.  PT/INR: No results for input(s): LABPROT, INR in the last 72 hours.   Radiology No results found.   Assessment/Plan: S/P Procedure(s) (LRB): VIDEO BRONCHOSCOPY (N/A) VIDEO ASSISTED THORACOSCOPY (VATS)/LEFT LOWER LOBECTOMY (Left)  Plan surgery tomorrow. 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   The goals risks and alternatives of the planned surgical procedure bronchoscopy and left VATS and lung resection have been discussed with the patient in detail. The risks of the procedure including death, infection, stroke, myocardial infarction, bleeding, blood transfusion have all been discussed specifically.  I have quoted Henry Guerrero a 3 % of perioperative mortality and a complication rate as high as 20 %. The patient's questions have been answered.Henry Guerrero is willing  to proceed with the planned procedure.   [name] MD 12/05/2014 9:00 AM

## 2014-12-06 ENCOUNTER — Inpatient Hospital Stay (HOSPITAL_COMMUNITY): Payer: 59

## 2014-12-06 ENCOUNTER — Encounter (HOSPITAL_COMMUNITY)
Admission: EM | Disposition: A | Discharge status: Home / Self Care | Payer: Self-pay | Source: Home / Self Care | Attending: Internal Medicine

## 2014-12-06 ENCOUNTER — Encounter (HOSPITAL_COMMUNITY): Payer: Self-pay | Admitting: Anesthesiology

## 2014-12-06 ENCOUNTER — Inpatient Hospital Stay (HOSPITAL_COMMUNITY): Payer: 59 | Admitting: Certified Registered"

## 2014-12-06 HISTORY — PX: VIDEO BRONCHOSCOPY: SHX5072

## 2014-12-06 HISTORY — PX: VIDEO ASSISTED THORACOSCOPY (VATS)/ LOBECTOMY: SHX6169

## 2014-12-06 LAB — URINE MICROSCOPIC-ADD ON

## 2014-12-06 LAB — PREPARE RBC (CROSSMATCH)

## 2014-12-06 SURGERY — BRONCHOSCOPY, VIDEO-ASSISTED
Anesthesia: General | Site: Chest

## 2014-12-06 MED ORDER — MEPERIDINE HCL 25 MG/ML IJ SOLN: 6.2500 mg | INTRAMUSCULAR | Status: DC | PRN

## 2014-12-06 MED ORDER — PROPOFOL 10 MG/ML IV BOLUS
INTRAVENOUS | Status: AC
  Filled 2014-12-06: qty 20

## 2014-12-06 MED ORDER — NEOSTIGMINE METHYLSULFATE 10 MG/10ML IV SOLN
INTRAVENOUS | Status: DC | PRN
  Administered 2014-12-06: 4 mg via INTRAVENOUS

## 2014-12-06 MED ORDER — FENTANYL 10 MCG/ML IV SOLN
INTRAVENOUS | Status: DC
  Administered 2014-12-06: 285 ug via INTRAVENOUS
  Administered 2014-12-06: 120 ug via INTRAVENOUS
  Administered 2014-12-06: 14:00:00 via INTRAVENOUS
  Filled 2014-12-06 (×2): qty 50

## 2014-12-06 MED ORDER — ONDANSETRON HCL 4 MG/2ML IJ SOLN
INTRAMUSCULAR | Status: AC
  Filled 2014-12-06: qty 2

## 2014-12-06 MED ORDER — VECURONIUM BROMIDE 10 MG IV SOLR
INTRAVENOUS | Status: DC | PRN
  Administered 2014-12-06 (×4): 3 mg via INTRAVENOUS
  Administered 2014-12-06: 4 mg via INTRAVENOUS

## 2014-12-06 MED ORDER — MIDAZOLAM HCL 2 MG/2ML IJ SOLN
INTRAMUSCULAR | Status: AC
  Filled 2014-12-06: qty 4

## 2014-12-06 MED ORDER — HEMOSTATIC AGENTS (NO CHARGE) OPTIME
TOPICAL | Status: DC | PRN
  Administered 2014-12-06: 1 via TOPICAL

## 2014-12-06 MED ORDER — LACTATED RINGERS IV SOLN
INTRAVENOUS | Status: DC | PRN
  Administered 2014-12-06 (×2): via INTRAVENOUS

## 2014-12-06 MED ORDER — 0.9 % SODIUM CHLORIDE (POUR BTL) OPTIME
TOPICAL | Status: DC | PRN
  Administered 2014-12-06: 2000 mL

## 2014-12-06 MED ORDER — OXYCODONE HCL 5 MG PO TABS
5.0000 mg | ORAL_TABLET | ORAL | Status: DC | PRN
  Administered 2014-12-06 – 2014-12-08 (×4): 5 mg via ORAL
  Administered 2014-12-09 – 2014-12-10 (×3): 10 mg via ORAL
  Filled 2014-12-06: qty 1
  Filled 2014-12-06: qty 2
  Filled 2014-12-06: qty 1
  Filled 2014-12-06: qty 2
  Filled 2014-12-06 (×2): qty 1
  Filled 2014-12-06 (×2): qty 2
  Filled 2014-12-06: qty 1

## 2014-12-06 MED ORDER — MIDAZOLAM HCL 5 MG/5ML IJ SOLN
INTRAMUSCULAR | Status: DC | PRN
  Administered 2014-12-06: 2 mg via INTRAVENOUS

## 2014-12-06 MED ORDER — DIPHENHYDRAMINE HCL 50 MG/ML IJ SOLN: 12.5000 mg | Freq: Four times a day (QID) | INTRAMUSCULAR | Status: DC | PRN

## 2014-12-06 MED ORDER — SODIUM CHLORIDE 0.9 % IV SOLN
10.0000 mg | INTRAVENOUS | Status: DC | PRN
  Administered 2014-12-06: 10 ug/min via INTRAVENOUS

## 2014-12-06 MED ORDER — NALOXONE HCL 0.4 MG/ML IJ SOLN: 0.4000 mg | INTRAMUSCULAR | Status: DC | PRN

## 2014-12-06 MED ORDER — SODIUM CHLORIDE 0.9 % IJ SOLN: 9.0000 mL | INTRAMUSCULAR | Status: DC | PRN

## 2014-12-06 MED ORDER — HYDROMORPHONE 0.3 MG/ML IV SOLN
INTRAVENOUS | Status: DC
  Administered 2014-12-06: 23:00:00 via INTRAVENOUS
  Administered 2014-12-07 (×2): 2.4 mg via INTRAVENOUS
  Administered 2014-12-07: 0.3 mg via INTRAVENOUS
  Administered 2014-12-07: 13:00:00 via INTRAVENOUS
  Administered 2014-12-08: 0.6 mg via INTRAVENOUS
  Administered 2014-12-08: 2.4 mg via INTRAVENOUS
  Administered 2014-12-08: 0.3 mg via INTRAVENOUS
  Administered 2014-12-08: 0.9 mg via INTRAVENOUS
  Administered 2014-12-09: 0.3 mg via INTRAVENOUS
  Administered 2014-12-09: 0.6 mg via INTRAVENOUS
  Filled 2014-12-06 (×2): qty 25

## 2014-12-06 MED ORDER — DEXTROSE 5 % IV SOLN
750.0000 mg | INTRAVENOUS | Status: DC
  Filled 2014-12-06: qty 750

## 2014-12-06 MED ORDER — METOCLOPRAMIDE HCL 5 MG/ML IJ SOLN
10.0000 mg | Freq: Four times a day (QID) | INTRAMUSCULAR | Status: AC
  Administered 2014-12-06 – 2014-12-07 (×3): 10 mg via INTRAVENOUS
  Filled 2014-12-06 (×4): qty 2

## 2014-12-06 MED ORDER — VECURONIUM BROMIDE 10 MG IV SOLR
INTRAVENOUS | Status: AC
  Filled 2014-12-06: qty 10

## 2014-12-06 MED ORDER — ROCURONIUM BROMIDE 50 MG/5ML IV SOLN
INTRAVENOUS | Status: AC
  Filled 2014-12-06: qty 1

## 2014-12-06 MED ORDER — ROCURONIUM BROMIDE 100 MG/10ML IV SOLN
INTRAVENOUS | Status: DC | PRN
  Administered 2014-12-06: 50 mg via INTRAVENOUS

## 2014-12-06 MED ORDER — LEVALBUTEROL HCL 0.63 MG/3ML IN NEBU: 0.6300 mg | INHALATION_SOLUTION | Freq: Four times a day (QID) | RESPIRATORY_TRACT | Status: DC

## 2014-12-06 MED ORDER — STERILE WATER FOR INJECTION IJ SOLN
INTRAMUSCULAR | Status: AC
  Filled 2014-12-06: qty 10

## 2014-12-06 MED ORDER — HYDROMORPHONE HCL 1 MG/ML IJ SOLN
INTRAMUSCULAR | Status: AC
  Filled 2014-12-06: qty 1

## 2014-12-06 MED ORDER — ONDANSETRON HCL 4 MG/2ML IJ SOLN: 4.0000 mg | Freq: Four times a day (QID) | INTRAMUSCULAR | Status: DC | PRN

## 2014-12-06 MED ORDER — DIPHENHYDRAMINE HCL 12.5 MG/5ML PO ELIX
12.5000 mg | ORAL_SOLUTION | Freq: Four times a day (QID) | ORAL | Status: DC | PRN
  Filled 2014-12-06: qty 5

## 2014-12-06 MED ORDER — DEXTROSE 5 % IV SOLN
1.5000 g | Freq: Two times a day (BID) | INTRAVENOUS | Status: AC
  Administered 2014-12-07 (×2): 1.5 g via INTRAVENOUS
  Filled 2014-12-06 (×3): qty 1.5

## 2014-12-06 MED ORDER — TRAMADOL HCL 50 MG PO TABS
50.0000 mg | ORAL_TABLET | Freq: Four times a day (QID) | ORAL | Status: DC | PRN
  Administered 2014-12-06: 100 mg via ORAL
  Filled 2014-12-06: qty 2

## 2014-12-06 MED ORDER — BUPIVACAINE HCL 0.5 % IJ SOLN
INTRAMUSCULAR | Status: DC | PRN
  Administered 2014-12-06: 10 mL

## 2014-12-06 MED ORDER — LIDOCAINE HCL (CARDIAC) 20 MG/ML IV SOLN
INTRAVENOUS | Status: DC | PRN
  Administered 2014-12-06: 80 mg via INTRAVENOUS

## 2014-12-06 MED ORDER — MIDAZOLAM HCL 2 MG/2ML IJ SOLN: 0.5000 mg | Freq: Once | INTRAMUSCULAR | Status: DC | PRN

## 2014-12-06 MED ORDER — BUPIVACAINE 0.5 % ON-Q PUMP SINGLE CATH 400 ML
400.0000 mL | INJECTION | Status: DC
  Administered 2014-12-06: 400 mL
  Filled 2014-12-06: qty 400

## 2014-12-06 MED ORDER — ACETAMINOPHEN 160 MG/5ML PO SOLN
1000.0000 mg | Freq: Four times a day (QID) | ORAL | Status: DC
  Administered 2014-12-06: 1000 mg via ORAL
  Filled 2014-12-06 (×19): qty 40

## 2014-12-06 MED ORDER — HYDROMORPHONE HCL 1 MG/ML IJ SOLN
INTRAMUSCULAR | Status: AC
  Administered 2014-12-06: 0.5 mg via INTRAVENOUS
  Filled 2014-12-06: qty 1

## 2014-12-06 MED ORDER — FENTANYL CITRATE (PF) 250 MCG/5ML IJ SOLN
INTRAMUSCULAR | Status: AC
  Filled 2014-12-06: qty 5

## 2014-12-06 MED ORDER — LEVALBUTEROL HCL 0.63 MG/3ML IN NEBU
0.6300 mg | INHALATION_SOLUTION | Freq: Four times a day (QID) | RESPIRATORY_TRACT | Status: DC
  Administered 2014-12-06 – 2014-12-07 (×4): 0.63 mg via RESPIRATORY_TRACT
  Filled 2014-12-06 (×9): qty 3

## 2014-12-06 MED ORDER — ACETAMINOPHEN 500 MG PO TABS
1000.0000 mg | ORAL_TABLET | Freq: Four times a day (QID) | ORAL | Status: DC
  Administered 2014-12-07 – 2014-12-10 (×15): 1000 mg via ORAL
  Filled 2014-12-06 (×20): qty 2

## 2014-12-06 MED ORDER — FENTANYL CITRATE (PF) 100 MCG/2ML IJ SOLN
INTRAMUSCULAR | Status: DC | PRN
  Administered 2014-12-06 (×5): 50 ug via INTRAVENOUS
  Administered 2014-12-06: 100 ug via INTRAVENOUS
  Administered 2014-12-06: 200 ug via INTRAVENOUS
  Administered 2014-12-06 (×2): 50 ug via INTRAVENOUS

## 2014-12-06 MED ORDER — GLYCOPYRROLATE 0.2 MG/ML IJ SOLN
INTRAMUSCULAR | Status: DC | PRN
  Administered 2014-12-06: 0.6 mg via INTRAVENOUS

## 2014-12-06 MED ORDER — DEXTROSE-NACL 5-0.45 % IV SOLN
INTRAVENOUS | Status: DC
  Administered 2014-12-06 – 2014-12-07 (×2): via INTRAVENOUS

## 2014-12-06 MED ORDER — PROPOFOL 10 MG/ML IV BOLUS
INTRAVENOUS | Status: DC | PRN
  Administered 2014-12-06: 200 mg via INTRAVENOUS

## 2014-12-06 MED ORDER — HYDROMORPHONE HCL 1 MG/ML IJ SOLN
0.2500 mg | INTRAMUSCULAR | Status: DC | PRN
  Administered 2014-12-06 (×4): 0.5 mg via INTRAVENOUS

## 2014-12-06 MED ORDER — PROMETHAZINE HCL 25 MG/ML IJ SOLN: 6.2500 mg | INTRAMUSCULAR | Status: DC | PRN

## 2014-12-06 MED ORDER — ALBUMIN HUMAN 5 % IV SOLN
INTRAVENOUS | Status: DC | PRN
  Administered 2014-12-06: 10:00:00 via INTRAVENOUS

## 2014-12-06 MED ORDER — SODIUM CHLORIDE 0.9 % IV SOLN: Freq: Once | INTRAVENOUS | Status: DC

## 2014-12-06 MED ORDER — SENNOSIDES-DOCUSATE SODIUM 8.6-50 MG PO TABS
1.0000 | ORAL_TABLET | Freq: Every day | ORAL | Status: DC
  Administered 2014-12-06 – 2014-12-08 (×3): 1 via ORAL
  Filled 2014-12-06 (×5): qty 1

## 2014-12-06 MED ORDER — LIDOCAINE HCL (CARDIAC) 20 MG/ML IV SOLN
INTRAVENOUS | Status: AC
  Filled 2014-12-06: qty 5

## 2014-12-06 MED ORDER — GLYCOPYRROLATE 0.2 MG/ML IJ SOLN
INTRAMUSCULAR | Status: AC
  Filled 2014-12-06: qty 3

## 2014-12-06 MED ORDER — POTASSIUM CHLORIDE 10 MEQ/50ML IV SOLN: 10.0000 meq | Freq: Every day | INTRAVENOUS | Status: DC | PRN

## 2014-12-06 MED ORDER — BUPIVACAINE HCL (PF) 0.5 % IJ SOLN
INTRAMUSCULAR | Status: AC
  Filled 2014-12-06: qty 10

## 2014-12-06 MED ORDER — BISACODYL 5 MG PO TBEC
10.0000 mg | DELAYED_RELEASE_TABLET | Freq: Every day | ORAL | Status: DC
  Administered 2014-12-07 – 2014-12-09 (×3): 10 mg via ORAL
  Filled 2014-12-06 (×3): qty 2

## 2014-12-06 MED ORDER — NEOSTIGMINE METHYLSULFATE 10 MG/10ML IV SOLN
INTRAVENOUS | Status: AC
  Filled 2014-12-06: qty 1

## 2014-12-06 SURGICAL SUPPLY — 88 items
APPLICATOR TIP EXT COSEAL (VASCULAR PRODUCTS) IMPLANT
BLADE SURG 11 STRL SS (BLADE) ×3 IMPLANT
CANISTER SUCTION 2500CC (MISCELLANEOUS) ×3 IMPLANT
CATH KIT ON Q 5IN SLV (PAIN MANAGEMENT) ×3 IMPLANT
CATH THORACIC 28FR (CATHETERS) ×3 IMPLANT
CATH THORACIC 36FR (CATHETERS) IMPLANT
CATH THORACIC 36FR RT ANG (CATHETERS) IMPLANT
CLIP TI MEDIUM 6 (CLIP) ×3 IMPLANT
CLIP TI WIDE RED SMALL 6 (CLIP) ×3 IMPLANT
CONN ST 1/4X3/8  BEN (MISCELLANEOUS) ×2
CONN ST 1/4X3/8 BEN (MISCELLANEOUS) ×4 IMPLANT
CONN Y 3/8X3/8X3/8  BEN (MISCELLANEOUS)
CONN Y 3/8X3/8X3/8 BEN (MISCELLANEOUS) IMPLANT
CONT SPEC 4OZ CLIKSEAL STRL BL (MISCELLANEOUS) ×6 IMPLANT
COVER SURGICAL LIGHT HANDLE (MISCELLANEOUS) ×3 IMPLANT
COVER TABLE BACK 60X90 (DRAPES) ×3 IMPLANT
DERMABOND ADVANCED (GAUZE/BANDAGES/DRESSINGS) ×1
DERMABOND ADVANCED .7 DNX12 (GAUZE/BANDAGES/DRESSINGS) ×2 IMPLANT
DRAIN CHANNEL 28F RND 3/8 FF (WOUND CARE) IMPLANT
DRAPE LAPAROSCOPIC ABDOMINAL (DRAPES) ×3 IMPLANT
DRAPE PROXIMA HALF (DRAPES) ×3 IMPLANT
DRILL BIT 7/64X5 (BIT) IMPLANT
ELECT BLADE 4.0 EZ CLEAN MEGAD (MISCELLANEOUS) ×3
ELECT BLADE 6.5 EXT (BLADE) ×3 IMPLANT
ELECT REM PT RETURN 9FT ADLT (ELECTROSURGICAL) ×3
ELECTRODE BLDE 4.0 EZ CLN MEGD (MISCELLANEOUS) ×2 IMPLANT
ELECTRODE REM PT RTRN 9FT ADLT (ELECTROSURGICAL) ×2 IMPLANT
GAUZE SPONGE 4X4 12PLY STRL (GAUZE/BANDAGES/DRESSINGS) ×3 IMPLANT
GLOVE BIO SURGEON STRL SZ 6.5 (GLOVE) ×6 IMPLANT
GLOVE BIOGEL PI IND STRL 6.5 (GLOVE) ×4 IMPLANT
GLOVE BIOGEL PI IND STRL 7.0 (GLOVE) ×2 IMPLANT
GLOVE BIOGEL PI INDICATOR 6.5 (GLOVE) ×2
GLOVE BIOGEL PI INDICATOR 7.0 (GLOVE) ×1
GLOVE SS BIOGEL STRL SZ 6.5 (GLOVE) ×2 IMPLANT
GLOVE SUPERSENSE BIOGEL SZ 6.5 (GLOVE) ×1
GLOVE SURG SS PI 6.5 STRL IVOR (GLOVE) ×6 IMPLANT
GOWN STRL REIN XL XLG (GOWN DISPOSABLE) ×6 IMPLANT
GOWN STRL REUS W/ TWL LRG LVL3 (GOWN DISPOSABLE) ×6 IMPLANT
GOWN STRL REUS W/TWL LRG LVL3 (GOWN DISPOSABLE) ×3
KIT BASIN OR (CUSTOM PROCEDURE TRAY) ×3 IMPLANT
KIT ROOM TURNOVER OR (KITS) ×3 IMPLANT
LOOP VESSEL MAXI BLUE (MISCELLANEOUS) ×3 IMPLANT
NS IRRIG 1000ML POUR BTL (IV SOLUTION) ×9 IMPLANT
OIL SILICONE PENTAX (PARTS (SERVICE/REPAIRS)) ×3 IMPLANT
PACK CHEST (CUSTOM PROCEDURE TRAY) ×3 IMPLANT
PAD ARMBOARD 7.5X6 YLW CONV (MISCELLANEOUS) ×6 IMPLANT
PASSER SUT SWANSON 36MM LOOP (INSTRUMENTS) ×3 IMPLANT
POUCH ENDO CATCH II 15MM (MISCELLANEOUS) ×3 IMPLANT
RELOAD GOLD ECHELON 45 (STAPLE) ×15 IMPLANT
RELOAD GREEN ECHELON 45 (STAPLE) ×3 IMPLANT
SEALANT PROGEL (MISCELLANEOUS) ×3 IMPLANT
SEALANT SURG COSEAL 4ML (VASCULAR PRODUCTS) IMPLANT
SEALANT SURG COSEAL 8ML (VASCULAR PRODUCTS) IMPLANT
SOLUTION ANTI FOG 6CC (MISCELLANEOUS) ×3 IMPLANT
SPONGE GAUZE 4X4 12PLY STER LF (GAUZE/BANDAGES/DRESSINGS) ×3 IMPLANT
SPONGE INTESTINAL PEANUT (DISPOSABLE) ×9 IMPLANT
STAPLE RELOAD 2.5MM WHITE (STAPLE) ×15 IMPLANT
STAPLER ECHELON POWERED (MISCELLANEOUS) ×3 IMPLANT
STAPLER VASCULAR ECHELON 35 (CUTTER) ×3 IMPLANT
STOPCOCK 4 WAY LG BORE MALE ST (IV SETS) ×3 IMPLANT
SUT PROLENE 3 0 SH DA (SUTURE) IMPLANT
SUT PROLENE 4 0 RB 1 (SUTURE) ×1
SUT PROLENE 4-0 RB1 .5 CRCL 36 (SUTURE) ×2 IMPLANT
SUT SILK  1 MH (SUTURE) ×3
SUT SILK 1 MH (SUTURE) ×6 IMPLANT
SUT SILK 1 TIES 10X30 (SUTURE) ×3 IMPLANT
SUT SILK 2 0SH CR/8 30 (SUTURE) ×3 IMPLANT
SUT VIC AB 1 CTX 18 (SUTURE) ×3 IMPLANT
SUT VIC AB 1 CTX 36 (SUTURE) ×1
SUT VIC AB 1 CTX36XBRD ANBCTR (SUTURE) ×2 IMPLANT
SUT VIC AB 2 TP1 27 (SUTURE) ×3 IMPLANT
SUT VIC AB 2-0 CTX 36 (SUTURE) ×3 IMPLANT
SUT VIC AB 3-0 X1 27 (SUTURE) ×3 IMPLANT
SUT VICRYL 0 UR6 27IN ABS (SUTURE) IMPLANT
SUT VICRYL 2 TP 1 (SUTURE) IMPLANT
SYR 20ML ECCENTRIC (SYRINGE) ×3 IMPLANT
SYSTEM SAHARA CHEST DRAIN RE-I (WOUND CARE) ×3 IMPLANT
TAPE CLOTH 4X10 WHT NS (GAUZE/BANDAGES/DRESSINGS) ×3 IMPLANT
TAPE CLOTH SURG 6X10 WHT LF (GAUZE/BANDAGES/DRESSINGS) ×3 IMPLANT
TAPE UMBILICAL COTTON 1/8X30 (MISCELLANEOUS) ×3 IMPLANT
TIP APPLICATOR SPRAY EXTEND 16 (VASCULAR PRODUCTS) ×3 IMPLANT
TOWEL OR 17X24 6PK STRL BLUE (TOWEL DISPOSABLE) ×6 IMPLANT
TOWEL OR 17X26 10 PK STRL BLUE (TOWEL DISPOSABLE) ×6 IMPLANT
TRAY FOLEY CATH 16FRSI W/METER (SET/KITS/TRAYS/PACK) ×3 IMPLANT
TROCAR XCEL BLUNT TIP 100MML (ENDOMECHANICALS) ×3 IMPLANT
TUBE CONNECTING 20X1/4 (TUBING) ×3 IMPLANT
TUNNELER SHEATH ON-Q 11GX8 DSP (PAIN MANAGEMENT) ×3 IMPLANT
WATER STERILE IRR 1000ML POUR (IV SOLUTION) ×6 IMPLANT

## 2014-12-06 NOTE — Progress Notes (Signed)
Patient ID: Henry Guerrero, male   DOB: 1976-01-27, 39 y.o.   MRN: [id number] [facility] DAILY ICU PROGRESS NOTE                   [address]            [place] [address]          [phone]   Day of Surgery Procedure(s) (LRB): VIDEO BRONCHOSCOPY (N/A) VIDEO ASSISTED THORACOSCOPY (VATS)/LEFT LOWER LOBECTOMY (Left)  Total Length of Stay:  LOS: 7 days   Subjective: Stable post op on 3s, awake and alert   Objective: Vital signs in last 24 hours: Temp:  [97.8 F (36.6 C)-98.7 F (37.1 C)] 98.6 F (37 C) (08/19 1521) Pulse Rate:  [62-108] 96 (08/19 1521) Cardiac Rhythm:  [-] Normal sinus rhythm (08/19 0350) Resp:  [9-24] 15 (08/19 1521) BP: (108-129)/(69-81) 129/81 mmHg (08/19 1521) SpO2:  [98 %-100 %] 100 % (08/19 1521) Arterial Line BP: (137-163)/(65-78) 163/75 mmHg (08/19 1521)  Filed Weights   11/29/14 0537  Weight: 162 lb (73.483 kg)    Weight change:    Hemodynamic parameters for last 24 hours:    Intake/Output from previous day: 08/18 0701 - 08/19 0700 In: -  Out: 300 [Urine:300]  Intake/Output this shift: Total I/O In: 2450 [I.V.:2200; IV Piggyback:250] Out: 600 [Urine:450; Blood:150]  Current Meds: Scheduled Meds: . sodium chloride   Intravenous Once  . acetaminophen  1,000 mg Oral 4 times per day   Or  . acetaminophen (TYLENOL) oral liquid 160 mg/5 mL  1,000 mg Oral 4 times per day  . bisacodyl  10 mg Oral Daily  . cefUROXime (ZINACEF)  IV  1.5 g Intravenous Q12H  . cetirizine  5 mg Oral Daily  . fentaNYL   Intravenous 6 times per day  . HYDROmorphone      . levalbuterol  0.63 mg Nebulization Q6H  . metoCLOPramide (REGLAN) injection  10 mg Intravenous 4 times per day  . pantoprazole  40 mg Oral BID  . senna-docusate  1 tablet Oral QHS  . sodium chloride  3 mL Intravenous Q12H   Continuous Infusions: . dextrose 5 % and 0.45% NaCl 100 mL/hr at 12/06/14 1531   PRN Meds:.acetaminophen **OR** acetaminophen, alum & mag  hydroxide-simeth, diphenhydrAMINE **OR** diphenhydrAMINE, HYDROmorphone (DILAUDID) injection, iohexol, meperidine (DEMEROL) injection, midazolam, naloxone **AND** sodium chloride, ondansetron (ZOFRAN) IV, ondansetron **OR** [DISCONTINUED] ondansetron (ZOFRAN) IV, oxyCODONE, potassium chloride, promethazine, traMADol    Lab Results: CBC: Recent Labs  12/05/14 1909  WBC 7.1  HGB 15.7  HCT 46.3  PLT 243   BMET:  Recent Labs  12/05/14 1909  NA 136  K 3.8  CL 103  CO2 24  GLUCOSE 147*  BUN 12  CREATININE 0.96  CALCIUM 9.2    PT/INR:  Recent Labs  12/05/14 1909  LABPROT 13.2  INR 0.98   Radiology: Dg Chest 2 View  12/05/2014   CLINICAL DATA:  Hemoptysis.  Scheduled for surgery.  EXAM: CHEST  2 VIEW  COMPARISON:  12/01/2014  FINDINGS: Few densities at the left lung base are suggestive for atelectasis. Otherwise, the lungs are clear. Heart and mediastinum are within normal limits. The trachea is midline. No pleural effusions. No acute bone abnormality.  IMPRESSION: Left basilar atelectasis.   Electronically Signed   By: Richarda Overlie M.D.   On: 12/05/2014 20:49   Dg Chest Port 1 View  12/06/2014   CLINICAL DATA:  Status post left lobectomy with central  catheter placement  EXAM: PORTABLE CHEST - 1 VIEW  COMPARISON:  December 05, 2014  FINDINGS: There are 2 chest tubes on the left. No pneumothorax. Central catheter tip is in the superior vena cava.  No edema or consolidation. Heart size and pulmonary vascularity are normal. No adenopathy. No bone lesions.  IMPRESSION: Chest tubes on the left without apparent pneumothorax. Central catheter tip in superior vena cava. No edema or consolidation. Cardiac silhouette within normal limits.   Electronically Signed   By: Bretta Bang III M.D.   On: 12/06/2014 14:26     Assessment/Plan: S/P Procedure(s) (LRB): VIDEO BRONCHOSCOPY (N/A) VIDEO ASSISTED THORACOSCOPY (VATS)/LEFT LOWER LOBECTOMY (Left) Stable post op     Henry Guerrero 12/06/2014 6:31 PM

## 2014-12-06 NOTE — Transfer of Care (Signed)
Immediate Anesthesia Transfer of Care Note  Patient: Henry Guerrero  Procedure(s) Performed: Procedure(s): VIDEO BRONCHOSCOPY (N/A) VIDEO ASSISTED THORACOSCOPY (VATS)/LEFT LOWER LOBECTOMY (Left)  Patient Location: PACU  Anesthesia Type:General  Level of Consciousness: awake, alert  and oriented  Airway & Oxygen Therapy: Patient Spontanous Breathing and Patient connected to face mask oxygen  Post-op Assessment: Report given to RN, Post -op Vital signs reviewed and stable and Patient moving all extremities X 4  Post vital signs: Reviewed and stable  Last Vitals:  Filed Vitals:   12/06/14 1344  BP:   Pulse:   Temp: 36.6 C  Resp:     Complications: No apparent anesthesia complications

## 2014-12-06 NOTE — Anesthesia Procedure Notes (Addendum)
Anesthesia Procedure Note CVP: Timeout, sterile prep, drape, FBP L neck.  Trendelenburg position.  1% lido local, finder and trocar LIJ 1st pass with US guidance.  2 lumen placed over J wire. Biopatch and sterile dressing on.  Patient tolerated well.  VSS.  Jenita Seashore, MD  [phone] Procedure Name: Intubation Date/Time: 12/06/2014 7:44 AM Performed by: Kyung Rudd Pre-anesthesia Checklist: Patient identified, Emergency Drugs available, Suction available, Patient being monitored and Timeout performed Patient Re-evaluated:Patient Re-evaluated prior to inductionOxygen Delivery Method: Circle system utilized Preoxygenation: Pre-oxygenation with 100% oxygen Intubation Type: IV induction Ventilation: Mask ventilation without difficulty Laryngoscope Size: Mac and 4 Grade View: Grade I Endobronchial tube: Left, Double lumen EBT, EBT position confirmed by auscultation and EBT position confirmed by fiberoptic bronchoscope and 39 Fr Number of attempts: 1 Airway Equipment and Method: Stylet Placement Confirmation: ETT inserted through vocal cords under direct vision,  positive ETCO2 and breath sounds checked- equal and bilateral Tube secured with: Tape Dental Injury: Teeth and Oropharynx as per pre-operative assessment

## 2014-12-06 NOTE — Anesthesia Postprocedure Evaluation (Signed)
  Anesthesia Post-op Note  Patient: Henry Guerrero  Procedure(s) Performed: Procedure(s): VIDEO BRONCHOSCOPY (N/A) VIDEO ASSISTED THORACOSCOPY (VATS)/LEFT LOWER LOBECTOMY (Left)  Patient Location: PACU  Anesthesia Type:General  Level of Consciousness: sedated, patient cooperative and responds to stimulation  Airway and Oxygen Therapy: Patient Spontanous Breathing and Patient connected to nasal cannula oxygen  Post-op Pain: mild  Post-op Assessment: Post-op Vital signs reviewed, Patient's Cardiovascular Status Stable, Respiratory Function Stable, Patent Airway, No signs of Nausea or vomiting and Pain level controlled              Post-op Vital Signs: Reviewed and stable  Last Vitals:  Filed Vitals:   12/06/14 1521  BP: 129/81  Pulse: 96  Temp: 37 C  Resp: 15    Complications: No apparent anesthesia complications

## 2014-12-06 NOTE — Brief Op Note (Signed)
11/29/2014 - 12/06/2014  1:10 PM  PATIENT:  Henry Guerrero  39 y.o. 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  SURGEON:  Surgeon(s) and Role:    * [name], MD - Primary  PHYSICIAN ASSISTANT: Meckenzie Balsley PA-C  ANESTHESIA:   general  EBL:  Total I/O In: 1750 [I.V.:1500; IV Piggyback:250] Out: 450 [Urine:300; Blood:150]  BLOOD ADMINISTERED:none  DRAINS: 28 Blake, Straight Chest Tube Left Chest   LOCAL MEDICATIONS USED:  MARCAINE     SPECIMEN:  Source of Specimen:  Left Lower Lobe  DISPOSITION OF SPECIMEN:  PATHOLOGY  COUNTS:  YES  TOURNIQUET:  * No tourniquets in log *  DICTATION: .Dragon Dictation  PLAN OF CARE: Admit to inpatient   PATIENT DISPOSITION:  ICU - intubated and hemodynamically stable.   Delay start of Pharmacological VTE agent (>24hrs) due to surgical blood loss or risk of bleeding: yes

## 2014-12-07 ENCOUNTER — Inpatient Hospital Stay (HOSPITAL_COMMUNITY): Payer: 59

## 2014-12-07 LAB — BLOOD GAS, ARTERIAL
ACID-BASE EXCESS: 1.2 mmol/L (ref 0.0–2.0)
Bicarbonate: 25.8 mEq/L — ABNORMAL HIGH (ref 20.0–24.0)
DRAWN BY: 36527
O2 Content: 1 L/min
O2 SAT: 97.9 %
PATIENT TEMPERATURE: 98.6
PO2 ART: 94.7 mmHg (ref 80.0–100.0)
TCO2: 27.2 mmol/L (ref 0–100)
pCO2 arterial: 45 mmHg (ref 35.0–45.0)
pH, Arterial: 7.377 (ref 7.350–7.450)

## 2014-12-07 LAB — BASIC METABOLIC PANEL
ANION GAP: 6 (ref 5–15)
BUN: 5 mg/dL — ABNORMAL LOW (ref 6–20)
CALCIUM: 8.5 mg/dL — AB (ref 8.9–10.3)
CO2: 28 mmol/L (ref 22–32)
Chloride: 102 mmol/L (ref 101–111)
Creatinine, Ser: 0.69 mg/dL (ref 0.61–1.24)
GLUCOSE: 111 mg/dL — AB (ref 65–99)
POTASSIUM: 3.7 mmol/L (ref 3.5–5.1)
SODIUM: 136 mmol/L (ref 135–145)

## 2014-12-07 LAB — CBC
HEMATOCRIT: 38.4 % — AB (ref 39.0–52.0)
HEMOGLOBIN: 12.9 g/dL — AB (ref 13.0–17.0)
MCH: 29.2 pg (ref 26.0–34.0)
MCHC: 33.6 g/dL (ref 30.0–36.0)
MCV: 86.9 fL (ref 78.0–100.0)
Platelets: 200 10*3/uL (ref 150–400)
RBC: 4.42 MIL/uL (ref 4.22–5.81)
RDW: 13.5 % (ref 11.5–15.5)
WBC: 9.1 10*3/uL (ref 4.0–10.5)

## 2014-12-07 MED ORDER — KETOROLAC TROMETHAMINE 15 MG/ML IJ SOLN
15.0000 mg | Freq: Four times a day (QID) | INTRAMUSCULAR | Status: DC
  Administered 2014-12-07 – 2014-12-08 (×5): 15 mg via INTRAVENOUS
  Filled 2014-12-07 (×9): qty 1

## 2014-12-07 MED ORDER — LEVALBUTEROL HCL 0.63 MG/3ML IN NEBU: 0.6300 mg | INHALATION_SOLUTION | Freq: Four times a day (QID) | RESPIRATORY_TRACT | Status: DC | PRN

## 2014-12-07 NOTE — Progress Notes (Signed)
Patient respiratory assessment done and patient scored a 3. Changed treatments to PRN at this time. Will reevaluate as needed. Patient has clear x ray on 8-18 other than some atelectasis and Chest tube placement on post x-rays. No history noted and patient not taking any treatment regimen at home. Will revaluate as needed.

## 2014-12-07 NOTE — Progress Notes (Signed)
  [facility] TEAM 1 - Stepdown/ICU TEAM  Pt is now post-op.  Attending name has been changed to Dr. Tyrone Sage.  In that pt is 38yo, and otherwise healthy, I have nothing to offer in addition to the expert care of the [facility] Team.  I will therefore sign off.  Please don't hesitate to call if [facility] can further assist in the care of this patient.    Lonia Blood, MD [facility] For Consults/Admissions - Flow Manager [phone] Office  [phone]  Contact MD directly via text page:      [email or web address]      password [facility]

## 2014-12-07 NOTE — Progress Notes (Signed)
       [address]       [place] [address]             [phone]          1 Day Post-Op Procedure(s) (LRB): VIDEO BRONCHOSCOPY (N/A) VIDEO ASSISTED THORACOSCOPY (VATS)/LEFT LOWER LOBECTOMY (Left)  Subjective: C/o pain. PCA helps but doesn't last long enough. Breathing stable, no nausea.   Objective: Vital signs in last 24 hours: Patient Vitals for the past 24 hrs:  BP Temp Temp src Pulse Resp SpO2  12/07/14 0413 112/73 mmHg 98.4 F (36.9 C) Oral 88 20 98 %  12/06/14 2347 121/72 mmHg 97.9 F (36.6 C) Oral (!) 101 19 98 %  12/06/14 2324 - - - - (!) 22 98 %  12/06/14 2059 - - - - 20 97 %  12/06/14 2050 - - - - - 96 %  12/06/14 1942 122/84 mmHg 98.6 F (37 C) Oral (!) 104 (!) 21 98 %  12/06/14 1800 120/83 mmHg - - (!) 110 19 97 %  12/06/14 1521 129/81 mmHg 98.6 F (37 C) Oral 96 15 100 %  12/06/14 1515 - - - 92 17 100 %  12/06/14 1455 - 98.7 F (37.1 C) - 88 13 100 %  12/06/14 1445 118/79 mmHg - - 93 13 100 %  12/06/14 1430 122/77 mmHg - - 85 (!) 9 100 %  12/06/14 1415 - - - 83 14 100 %  12/06/14 1409 - - - 80 13 100 %  12/06/14 1400 117/77 mmHg - - 79 15 100 %  12/06/14 1345 - - - 85 (!) 22 100 %  12/06/14 1344 118/69 mmHg 97.9 F (36.6 C) - 78 14 100 %   Current Weight  11/29/14 162 lb (73.483 kg)     Intake/Output from previous day: 08/19 0701 - 08/20 0700 In: 3140 [P.O.:240; I.V.:2600; IV Piggyback:300] Out: 2350 [Urine:2150; Blood:150; Chest Tube:50]    PHYSICAL EXAM:  Heart: RRR Lungs: Clear though BS diminished bilaterally Wound: Dressed and dry Chest tube: No air leak    Lab Results: CBC: Recent Labs  12/05/14 1909 12/07/14 0430  WBC 7.1 9.1  HGB 15.7 12.9*  HCT 46.3 38.4*  PLT 243 200   BMET:  Recent Labs  12/05/14 1909 12/07/14 0430  NA 136 136  K 3.8 3.7  CL 103 102  CO2 24 28  GLUCOSE 147* 111*  BUN 12 5*  CREATININE 0.96 0.69  CALCIUM 9.2 8.5*    PT/INR:  Recent Labs  12/05/14 1909  LABPROT  13.2  INR 0.98      Assessment/Plan: S/P Procedure(s) (LRB): VIDEO BRONCHOSCOPY (N/A) VIDEO ASSISTED THORACOSCOPY (VATS)/LEFT LOWER LOBECTOMY (Left) CT output low, no air leak. Will follow up am CXR (not done yet). Possibly can decrease CT to water seal. Will add scheduled Toradol for pain control. Start pulm toilet/IS. Advance diet, decrease IVF, d/c a-line, routine POD #1 progression.   LOS: 8 days    Henry Guerrero H 12/07/2014

## 2014-12-07 NOTE — Progress Notes (Signed)
UR COMPLETED  

## 2014-12-08 ENCOUNTER — Encounter (HOSPITAL_COMMUNITY): Payer: Self-pay | Admitting: Cardiothoracic Surgery

## 2014-12-08 ENCOUNTER — Inpatient Hospital Stay (HOSPITAL_COMMUNITY): Payer: 59

## 2014-12-08 LAB — COMPREHENSIVE METABOLIC PANEL
ALBUMIN: 3.1 g/dL — AB (ref 3.5–5.0)
ALK PHOS: 44 U/L (ref 38–126)
ALT: 56 U/L (ref 17–63)
ANION GAP: 6 (ref 5–15)
AST: 23 U/L (ref 15–41)
BUN: 5 mg/dL — AB (ref 6–20)
CALCIUM: 8.7 mg/dL — AB (ref 8.9–10.3)
CO2: 31 mmol/L (ref 22–32)
Chloride: 103 mmol/L (ref 101–111)
Creatinine, Ser: 0.74 mg/dL (ref 0.61–1.24)
GFR calc Af Amer: >60 mL/min (ref >60)
GFR calc non Af Amer: >60 mL/min (ref >60)
GLUCOSE: 132 mg/dL — AB (ref 65–99)
POTASSIUM: 3.6 mmol/L (ref 3.5–5.1)
SODIUM: 140 mmol/L (ref 135–145)
Total Bilirubin: 0.4 mg/dL (ref 0.3–1.2)
Total Protein: 5.4 g/dL — ABNORMAL LOW (ref 6.5–8.1)

## 2014-12-08 LAB — CBC
HEMATOCRIT: 38.1 % — AB (ref 39.0–52.0)
HEMOGLOBIN: 12.6 g/dL — AB (ref 13.0–17.0)
MCH: 29.2 pg (ref 26.0–34.0)
MCHC: 33.1 g/dL (ref 30.0–36.0)
MCV: 88.4 fL (ref 78.0–100.0)
Platelets: 195 10*3/uL (ref 150–400)
RBC: 4.31 MIL/uL (ref 4.22–5.81)
RDW: 13.5 % (ref 11.5–15.5)
WBC: 8.7 10*3/uL (ref 4.0–10.5)

## 2014-12-08 MED ORDER — KETOROLAC TROMETHAMINE 15 MG/ML IJ SOLN
15.0000 mg | Freq: Four times a day (QID) | INTRAMUSCULAR | Status: AC
  Administered 2014-12-08 – 2014-12-09 (×5): 15 mg via INTRAVENOUS
  Filled 2014-12-08 (×4): qty 1

## 2014-12-08 NOTE — Progress Notes (Addendum)
       [address]       [name] [address]             [phone]          2 Days Post-Op Procedure(s) (LRB): VIDEO BRONCHOSCOPY (N/A) VIDEO ASSISTED THORACOSCOPY (VATS)/LEFT LOWER LOBECTOMY (Left)  Subjective: Just got back from walking.  Became a little dyspneic and coughed up some old blood.  Pain better controlled today.   Objective: Vital signs in last 24 hours: Patient Vitals for the past 24 hrs:  BP Temp Temp src Pulse Resp SpO2  12/08/14 0310 116/70 mmHg 97.8 F (36.6 C) Oral 78 (!) 21 100 %  12/07/14 2352 116/82 mmHg 98.3 F (36.8 C) Oral 88 (!) 23 100 %  12/07/14 1946 115/75 mmHg 98 F (36.7 C) Oral 89 20 99 %  12/07/14 1500 - - - - - 100 %  12/07/14 1400 - - - 76 17 100 %  12/07/14 1200 125/78 mmHg 99.7 F (37.6 C) Oral 83 18 98 %  12/07/14 0920 - - - - - 98 %   Current Weight  11/29/14 162 lb (73.483 kg)     Intake/Output from previous day: 08/20 0701 - 08/21 0700 In: 91478 [P.O.:840; I.V.:14000] Out: 2625 [Urine:2375; Chest Tube:250]    PHYSICAL EXAM:  Heart: RRR Lungs: Diminished BS in bases Wound: Clean and dry Chest tube: No air leak    Lab Results: CBC: Recent Labs  12/07/14 0430 12/08/14 0520  WBC 9.1 8.7  HGB 12.9* 12.6*  HCT 38.4* 38.1*  PLT 200 195   BMET:  Recent Labs  12/07/14 0430 12/08/14 0520  NA 136 140  K 3.7 3.6  CL 102 103  CO2 28 31  GLUCOSE 111* 132*  BUN 5* 5*  CREATININE 0.69 0.74  CALCIUM 8.5* 8.7*    PT/INR:  Recent Labs  12/05/14 1909  LABPROT 13.2  INR 0.98   CXR: stable, no pneumothorax   Assessment/Plan: S/P Procedure(s) (LRB): VIDEO BRONCHOSCOPY (N/A) VIDEO ASSISTED THORACOSCOPY (VATS)/LEFT LOWER LOBECTOMY (Left) CT output decreasing, no air leak. CXR stable with no pneumothorax.  Will d/c posterior CT and place remaining CT to water seal. Continue IS/pulm toilet. IVF to [facility], d/c Foley, ambulate in halls.   LOS: 9 days    COLLINS,GINA H 12/08/2014  CXR  looks great Post chest tube out today patient examined and medical record reviewed,agree with above note. Kathlee Nations Trigt III 12/08/2014

## 2014-12-09 ENCOUNTER — Inpatient Hospital Stay (HOSPITAL_COMMUNITY): Payer: 59

## 2014-12-09 LAB — TYPE AND SCREEN
ABO/RH(D): O POS
Antibody Screen: NEGATIVE
Unit division: 0
Unit division: 0
Unit division: 0
Unit division: 0

## 2014-12-09 MED ORDER — LACTULOSE 10 GM/15ML PO SOLN
20.0000 g | Freq: Once | ORAL | Status: AC
  Administered 2014-12-09: 20 g via ORAL
  Filled 2014-12-09: qty 30

## 2014-12-09 NOTE — Progress Notes (Addendum)
      [address]       [place] [address]             [phone]       3 Days Post-Op Procedure(s) (LRB): VIDEO BRONCHOSCOPY (N/A) VIDEO ASSISTED THORACOSCOPY (VATS)/LEFT LOWER LOBECTOMY (Left)  Subjective: Patient without complaints this am  Objective: Vital signs in last 24 hours: Temp:  [98.1 F (36.7 C)-98.8 F (37.1 C)] 98.5 F (36.9 C) (08/22 0700) Pulse Rate:  [74-89] 79 (08/22 0341) Cardiac Rhythm:  [-] Normal sinus rhythm (08/21 2007) Resp:  [16-23] 18 (08/22 0430) BP: (115-133)/(72-88) 115/76 mmHg (08/22 0341) SpO2:  [100 %] 100 % (08/22 0430)      Intake/Output from previous day: 08/21 0701 - 08/22 0700 In: 1210 [P.O.:960; I.V.:250] Out: 2000 [Urine:1900; Chest Tube:100]   Physical Exam:  Cardiovascular: RRR. Pulmonary: Clear to auscultation bilaterally; no rales, wheezes, or rhonchi. Abdomen: Soft, non tender, bowel sounds present. Extremities: SCDs in place Wounds: Clean and dry.  No erythema or signs of infection. Chest Tube: to water seal and no air leak  Lab Results: CBC: Recent Labs  12/07/14 0430 12/08/14 0520  WBC 9.1 8.7  HGB 12.9* 12.6*  HCT 38.4* 38.1*  PLT 200 195   BMET:  Recent Labs  12/07/14 0430 12/08/14 0520  NA 136 140  K 3.7 3.6  CL 102 103  CO2 28 31  GLUCOSE 111* 132*  BUN 5* 5*  CREATININE 0.69 0.74  CALCIUM 8.5* 8.7*    PT/INR: No results for input(s): LABPROT, INR in the last 72 hours. ABG:  INR: Will add last result for INR, ABG once components are confirmed Will add last 4 CBG results once components are confirmed  Assessment/Plan:  1. CV - SR in the 70's. 2.  Pulmonary - On 1 liter of oxygen via [place]. Wean to room air as tolerates. Chest tube with 100 cc of output last 24 hours. There is no air leak. Likely remove chest tube.CXR shows trace left apical pneumothorax, right basilar atelectasis.Encourage incentive spirometer. 3. H and H yesterday stable at 12.6 and 38.1 4. Will  remove PCA once chest tube removed 5. Possibly home in am  ZIMMERMAN,DONIELLE MPA-C 12/09/2014,7:58 AM  Ambulation well, no air leak, d/c On Q and cheat tube today I have seen and examined Wayne Both and agree with the above assessment  and plan.  [name] MD Beeper [phone] Office [phone] 12/09/2014 12:09 PM

## 2014-12-09 NOTE — Op Note (Signed)
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  SURGICAL PROCEDURES:  Video bronchoscopy, left video-assisted thoracoscopy, left lower lobectomy, and division of abnormal systemic artery to the left lower lobe.  SURGEON:  Sheliah Plane, M.D.  FIRST ASSISTANT:  Airen Barrett, PA  BRIEF HISTORY:  The patient is a 39 year old male, with no previous significant medical history or pulmonary problems, who presented a week prior with 3-week history of hemoptysis.  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  The patient underwent a systemic arteriogram and pulmonary angiogram and after a multidisciplinary conference with Interventional Radiology and surgery, it was decided that the safest route would be to proceed with surgical resection rather than attempting an embolization of the vessel as well as the blood supply would have resultant in a significantly sized pulmonary infarct.  Risks and options were discussed with the patient in detail and he agreed and signed informed consent.  DESCRIPTION OF PROCEDURE:  With central line and arterial line in place, the patient underwent general endotracheal anesthesia  without incident. Through the endotracheal tube, a fiberoptic bronchoscope was passed to the subsegmental level, both the right and left tracheobronchial tree without any obvious abnormalities, in addition there was no blood noted from either tracheobronchial tree.  The scope was removed.  The patient was turned in lateral decubitus position with the left side up.  The left chest was prepped with Betadine and draped in usual sterile manner. A second time-out was performed and confirming this side.  We then proceeded with a small port incision in approximately the 5th intercostal space.  Through this, we could identify the left lower lobe, there was some area in the basilar segments with slightly different purplish-colored discoloration than the remainder of the lung.  We then placed a larger port incision in between the small incision and the port incision, we proceeded __________ inferior pulmonary ligament taking care to identify the systemic artery crossing through the inferior pulmonary ligament and entering the left lower lobe in the vicinity of the inferior pulmonary vein.  The distal artery was identified and was encircled with the vessel loop.  We then proceeded to free up the inferior pulmonary vein.  With control of this artery, we then proceeded to dissect along the fissure and identifying the pulmonary artery branches to the left lower lobe taking care to define the anatomy well as this abnormal artery on arteriogram was known to  enter the lung and then bifurcate.  We then divided the pulmonary artery branch to the lower lobe and then divided with a vascular stapler and then divided the anomalous artery with a vascular stapler and divided the inferior pulmonary vein with a vascular stapler.  The bronchus to the lower lobe was then dissected and circled with a green load stapler.  Prior to firing the chest to ensure that the upper lobe inflated properly, the lower lobe bronchus  was then divided and the specimen removed and submitted to Pathology.  The bronchial stump was inspected was inflated against 30 mm of pressure without evidence of air leak.  An On-Q device was tunneled subpleural lesion along the posterior chest wall.  Two 28 chest tubes, 1 Blake drain, and 1 standard tube were left in the pleural space through the port sites.  The utilitarian incision that we had been working through was then closed with interrupted 0 Vicryl running 3-0 Vicryl subcutaneous tissue and a 3-0 subcuticular stitch in skin edges. Dry dressings were applied.  After completion of the procedure, the lung reinflated nicely without evidence of air leak.  Sponge and needle count was reported as correct at the completion of procedure.  The patient tolerated the procedure without obvious complication.  He was extubated in the operating room and transferred to the recovery room for further postoperative care.  Estimated blood loss was 150 mL.     Sheliah Plane, MD     EG/MEDQ  D:  12/08/2014  T:  12/08/2014  Job:  [id number]

## 2014-12-09 NOTE — Discharge Summary (Signed)
Physician Discharge Summary  Patient ID: Henry Guerrero MRN: [id number] DOB/AGE: 12/10/1975 39 y.o.  Admit date: 11/29/2014 Discharge date: 12/09/2014  Admission Diagnoses:  Discharge Diagnoses:  Active Problems:   Pulmonary hemorrhage   Pulmonary sequestration   Coughing up blood   Hemoptysis  Patient Active Problem List   Diagnosis Date Noted  . Coughing up blood   . Hemoptysis   . Pulmonary sequestration   . Pulmonary hemorrhage 11/29/2014   HPI: at admission Henry Guerrero is an 39 y.o. male with a PMH of GERD who presents to the ED with a 3 week history of coughing up blood. He saw Dr. Paulino Rily multiple times, he was initially diagnosed with a sinus infection and took a course of azithromycin. When the cough did not improve, he saw her again, and was referred to an allergist who diagnosed him with rhinitis and placed him on an antihistamine and a steroid nasal spray. He visited his PCP a 3rd time for non-resolution of symptoms, and had a CXR which showed subsegmental atelectasis and was given a second course of azithromycin and an albuterol inhaler, and scheduled for a CT of the chest. He presented here due to ongoing hemoptysis, described as bright red, multiple times a day. Denies any associated shortness of breath or chest pain. No aggravating factors.   Discharged Condition: good   Hospital Course: The patient was admitted under the care of the hospitalist service. He was additionally seen in thoracic surgical consultation by Dr. Sheliah Plane. 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 The patient was felt to require further evaluation to include interventional radiology consultation for arteriography. He was determined the best course of action would be to proceed with left lower lobectomy as his definitive treatment for this sequestration. 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  SURGICAL PROCEDURES: Video bronchoscopy, left video-assisted thoracoscopy, left lower lobectomy, and division of abnormal systemic artery to the left lower lobe.  SURGEON: Sheliah Plane, M.D.  FIRST ASSISTANT: Lowella Dandy, PA The patient tolerated the procedure without obvious complication. He was extubated in the operating room and transferred to the recovery room for further postoperative care. Estimated blood loss was 150 mL.  Post operative Hospital course: Overall the patient has done quite well. He is maintained stable hemodynamics. All routine lines, monitors and drainage devices have been discontinued in the standard fashion. Serial daily chest x-rays have been obtained to monitor progress. Hemoglobin and hematocrit are quite stable at 12.6 and 38.1 respectively. He is not having any further hemoptysis. Pain has been well-controlled with PCA and he is being transitioned to oral regimen. Incisions are healing well without evidence of infection. He is tolerating gradually increasing activities using standard protocols. Oxygen is being weaned and currently he is on 1 L by nasal cannula. Currently his status is felt to be tentatively stable for discharge in the next 24-48 hours pending ongoing reevaluation of his recovery. Diagnosis Lung, resection (segmental or lobe), left lower lobe - BENIGN LUNG TISSUE WITH ABERRANT VASCULATURE AND HEMORRHAGE, SEE COMMENT. - NEGATIVE FOR ATYPIA OR MALIGNANCY. - ONE LYMPH NODE, NEGATIVE FOR TUMOR (0/1). -  BRONCHOVASCULAR MARGIN, NEGATIVE  FOR ATYPIA OR MALIGNANCY  Consults: Cardiothoracic surgery  Significant Diagnostic Studies:  1. angiography:  Procedures    IR US GUIDE VASC ACCESS RIGHT [ZHY8657 (Custom)]   IR ANGIOGRAM SELECTIVE EACH ADDITIONAL VESSEL [QIO962 (Custom)]   IR ANGIOGRAM PULMONARY LEFT SELECTIVE [XBM841 (Custom)]   IR AORTA/THORACIC [LKG401 (Custom)]   IR ANGIOGRAM VISCERAL SELECTIVE [[id number] (Custom      2. CT angiogram of chest  Treatments: surgery: As noted above  Discharge Exam: Blood pressure 115/76, pulse 79, temperature 98.5 F (36.9 C), temperature source Oral, resp. rate 18, height 5\' 8"  (1.727 m), weight 162 lb (73.483 kg), SpO2 100 %.  General appearance: alert, cooperative and no distress Heart: regular rate and rhythm Lungs: clear to auscultation bilaterally Abdomen: benign Extremities: no edema Wound: incis healing well Disposition: home   Medications at discharge:    Medication List    STOP taking these medications        fluticasone 50 MCG/ACT nasal spray  Commonly known as:  FLONASE      TAKE these medications        dexlansoprazole 60 MG capsule  Commonly known as:  DEXILANT  Take 60 mg by mouth daily.     levocetirizine 5 MG tablet  Commonly known as:  XYZAL  Take 5 mg by mouth daily.     oxyCODONE 5 MG immediate release tablet  Commonly known as:  Oxy IR/ROXICODONE  Take 1-2 tablets (5-10 mg total) by mouth every 4 (four) hours as needed for severe pain.              Follow-up Information    Follow up with [name], MD.   Specialty:  Cardiothoracic Surgery   Why:  12/26/2014 at 12:15 PM to see the surgeon. Please obtain a chest x-ray [place] [facility] at 11:15 AM. [facility] imaging is located in the same office complex.   Contact information:   [address] [employer] [address] [place] [address] [phone]       Follow up with [facility] [place].   Specialty:  Cardiothoracic Surgery   Why:   12/16/2014 at 9:30 AM to have sutures removed by the nurse in Dr. Dennie Maizes office.   Contact information:   [address] [place], [address] [place] [address] [phone]      Signed: Rowe Clack 12/09/2014, 9:33 AM

## 2014-12-09 NOTE — Discharge Instructions (Signed)
Thoracotomy, Care After °Refer to this sheet in the next few weeks. These instructions provide you with information on caring for yourself after your procedure. Your health care provider may also give you more specific instructions. Your treatment has been planned according to current medical practices, but problems sometimes occur. Call your health care provider if you have any problems or questions after your procedure. °WHAT TO EXPECT AFTER YOUR PROCEDURE °After your procedure, it is typical to have the following sensations: °· You may feel pain at the incision site. °· You may be constipated from the pain medicine given and the change in your level of activity. °· You may feel extremely tired. °HOME CARE INSTRUCTIONS °· Take over-the-counter or prescription medicines for pain, discomfort, or fever only as directed by your health care provider. It is very important to take pain relieving medicine before your pain becomes severe. You will be able to breathe and cough more comfortably if your pain is well controlled. °· Take deep breaths. Deep breathing helps to keep your lungs inflated and protects against a lung infection (pneumonia). °· Cough frequently. Even though coughing may cause discomfort, coughing is important to clear mucus (phlegm) and expand your lungs. Coughing helps prevent pneumonia. If it hurts to cough, hold a pillow against your chest when you cough. This may help with the discomfort. °· Continue to use an incentive spirometer as directed. The use of an incentive spirometer helps to keep your lungs inflated and protects against pneumonia. °· Change the bandages over your incision as needed or as directed by your health care provider. °· Remove the bandages over your chest tube site as directed by your health care provider. °· Resume your normal diet as directed. It is important to have adequate protein, calories, vitamins, and minerals to promote healing. °· Prevent constipation. °¨ Eat  high-fiber foods such as whole grain cereals and breads, brown rice, beans, and fresh fruits and vegetables. °¨ Drink enough water and fluids to keep your urine clear or pale yellow. Avoid drinking beverages containing caffeine. Beverages containing caffeine can cause dehydration and harden your stool. °¨ Talk to your health care provider about taking a stool softener or laxative. °· Avoid lifting until you are instructed otherwise. °· Do not drive until directed by your health care provider.  Do not drive while taking pain medicines (narcotics). °· Do not bathe, swim, or use a hot tub until directed by your health care provider. You may shower instead. Gently wash the area of your incision with water and soap as directed. Do not use anything else to clean your incision except as directed by your health care provider. °· Do not use any tobacco products including cigarettes, chewing tobacco, or electronic cigarettes. °· Avoid secondhand smoke. °· Schedule an appointment for stitch (suture) or staple removal as directed. °· Schedule and attend all follow-up visits as directed by your health care provider. It is important to keep all your appointments. °· Participate in pulmonary rehabilitation as directed by your health care provider. °· Do not travel by airplane for 2 weeks after your chest tube is removed. °SEEK MEDICAL CARE IF: °· You are bleeding from your wounds. °· Your heartbeat seems irregular. °· You have redness, swelling, or increasing pain in the wounds. °· There is pus coming from your wounds. °· There is a bad smell coming from the wound or dressing. °· You have a fever or chills. °· You have nausea or are vomiting. °· You have muscle aches. °SEEK   IMMEDIATE MEDICAL CARE IF: °· You have a rash. °· You have difficulty breathing. °· You have a reaction or side effect to medicines given. °· You have persistent nausea. °· You have lightheadedness or feel faint. °· You have shortness of breath or chest  pain. °· You have persistent pain. °Document Released: 09/18/2010 Document Revised: 04/10/2013 Document Reviewed: 11/22/2012 °ExitCare® Patient Information ©2015 [employer]. This information is not intended to replace advice given to you by your health care provider. Make sure you discuss any questions you have with your health care provider. ° °

## 2014-12-09 NOTE — Progress Notes (Signed)
Chest Tube and On Q pump removed per MD order. Patient tolerated well.

## 2014-12-10 ENCOUNTER — Inpatient Hospital Stay (HOSPITAL_COMMUNITY): Payer: 59

## 2014-12-10 MED ORDER — OXYCODONE HCL 5 MG PO TABS: 5.0000 mg | ORAL_TABLET | ORAL | Status: DC | PRN

## 2014-12-10 NOTE — Progress Notes (Signed)
Patient discharged to home with family at approximately 1230 via wheelchair. Patient and family verbalized discharge instructions

## 2014-12-10 NOTE — Care Management Note (Signed)
Case Management Note  Patient Details  Name: Jen Eppinger MRN: [id number] Date of Birth: 1975/05/17  Subjective/If discussed at Long Length of Stay Meetings, dates discussed:  12/10/14  Additional Comments:  Epifanio Lesches, RN 12/10/2014, 11:47 AM

## 2014-12-10 NOTE — Progress Notes (Signed)
      [address]       [place] [address]             [phone]      4 Days Post-Op Procedure(s) (LRB): VIDEO BRONCHOSCOPY (N/A) VIDEO ASSISTED THORACOSCOPY (VATS)/LEFT LOWER LOBECTOMY (Left) Subjective: Some productive cough  Objective: Vital signs in last 24 hours: Temp:  [98.3 F (36.8 C)-98.5 F (36.9 C)] 98.4 F (36.9 C) (08/23 0313) Pulse Rate:  [75-93] 86 (08/23 0315) Cardiac Rhythm:  [-] Normal sinus rhythm (08/23 0315) Resp:  [16-21] 16 (08/23 0315) BP: (116-123)/(80-84) 120/84 mmHg (08/23 0315) SpO2:  [98 %-100 %] 98 % (08/23 0315)  Hemodynamic parameters for last 24 hours:    Intake/Output from previous day: 08/22 0701 - 08/23 0700 In: 60 [I.V.:60] Out: -  Intake/Output this shift:    General appearance: alert, cooperative and no distress Heart: regular rate and rhythm Lungs: clear to auscultation bilaterally Abdomen: benign Extremities: no edema Wound: incis healing well  Lab Results:  Recent Labs  12/08/14 0520  WBC 8.7  HGB 12.6*  HCT 38.1*  PLT 195   BMET:  Recent Labs  12/08/14 0520  NA 140  K 3.6  CL 103  CO2 31  GLUCOSE 132*  BUN 5*  CREATININE 0.74  CALCIUM 8.7*    PT/INR: No results for input(s): LABPROT, INR in the last 72 hours. ABG    Component Value Date/Time   PHART 7.377 12/07/2014 0500   HCO3 25.8* 12/07/2014 0500   TCO2 27.2 12/07/2014 0500   ACIDBASEDEF 0.8 12/05/2014 1835   O2SAT 97.9 12/07/2014 0500   CBG (last 3)  No results for input(s): GLUCAP in the last 72 hours.  Meds Scheduled Meds: . sodium chloride   Intravenous Once  . acetaminophen  1,000 mg Oral 4 times per day   Or  . acetaminophen (TYLENOL) oral liquid 160 mg/5 mL  1,000 mg Oral 4 times per day  . bisacodyl  10 mg Oral Daily  . cetirizine  5 mg Oral Daily  . pantoprazole  40 mg Oral BID  . senna-docusate  1 tablet Oral QHS  . sodium chloride  3 mL Intravenous Q12H   Continuous Infusions: . dextrose 5 % and  0.45% NaCl 10 mL/hr at 12/08/14 0900   PRN Meds:.acetaminophen **OR** acetaminophen, alum & mag hydroxide-simeth, iohexol, levalbuterol, ondansetron (ZOFRAN) IV, ondansetron **OR** [DISCONTINUED] ondansetron (ZOFRAN) IV, oxyCODONE, potassium chloride, traMADol  Xrays Dg Chest Port 1 View  12/09/2014   CLINICAL DATA:  Status post thoracotomy  EXAM: PORTABLE CHEST - 1 VIEW  COMPARISON:  12/08/2014  FINDINGS: Left IJ central line remains in good position. One of 2 left chest tubes remain in stable position. There is a trace left apical pneumothorax (less than 5%). No new pleural fluid or collapse. Mild bibasilar atelectasis is unchanged. Abnormal appearance of the retrocardiac lung in this patient with sequestration by angiography 12/02/2014.  IMPRESSION: 1. Trace left apical pneumothorax withremaining left chest tube in stable position. 2. Stable right basilar atelectasis. Retrocardiac density related to sequestration.   Electronically Signed   By: Marnee Spring M.D.   On: 12/09/2014 07:51    Assessment/Plan: S/P Procedure(s) (LRB): VIDEO BRONCHOSCOPY (N/A) VIDEO ASSISTED THORACOSCOPY (VATS)/LEFT LOWER LOBECTOMY (Left)  1 small apical pntx is stable 2 doing well, ok for discharge   LOS: 11 days    Henry Guerrero E 12/10/2014

## 2014-12-11 DIAGNOSIS — Z736 Limitation of activities due to disability: Secondary | ICD-10-CM

## 2014-12-16 ENCOUNTER — Ambulatory Visit (INDEPENDENT_AMBULATORY_CARE_PROVIDER_SITE_OTHER): Payer: Self-pay

## 2014-12-16 DIAGNOSIS — Z4802 Encounter for removal of sutures: Secondary | ICD-10-CM

## 2014-12-16 DIAGNOSIS — R042 Hemoptysis: Secondary | ICD-10-CM

## 2014-12-16 NOTE — Progress Notes (Signed)
Removed 4 sutures from chest tube incision sites with no signs of infection and patient tolerated well  . 

## 2014-12-25 ENCOUNTER — Other Ambulatory Visit: Payer: Self-pay | Admitting: Cardiothoracic Surgery

## 2014-12-25 DIAGNOSIS — Z902 Acquired absence of lung [part of]: Secondary | ICD-10-CM

## 2014-12-26 ENCOUNTER — Encounter: Payer: Self-pay | Admitting: Cardiothoracic Surgery

## 2014-12-26 ENCOUNTER — Ambulatory Visit (INDEPENDENT_AMBULATORY_CARE_PROVIDER_SITE_OTHER): Payer: Self-pay | Admitting: Cardiothoracic Surgery

## 2014-12-26 ENCOUNTER — Ambulatory Visit
Admission: RE | Admit: 2014-12-26 | Discharge: 2014-12-26 | Disposition: A | Discharge status: Home / Self Care | Payer: 59 | Source: Ambulatory Visit | Attending: Cardiothoracic Surgery | Admitting: Cardiothoracic Surgery

## 2014-12-26 VITALS — BP 124/85 | HR 84 | Resp 16 | Ht 68.0 in | Wt 160.0 lb

## 2014-12-26 DIAGNOSIS — Z902 Acquired absence of lung [part of]: Secondary | ICD-10-CM

## 2014-12-26 DIAGNOSIS — R918 Other nonspecific abnormal finding of lung field: Secondary | ICD-10-CM

## 2014-12-26 DIAGNOSIS — Z9889 Other specified postprocedural states: Secondary | ICD-10-CM

## 2014-12-26 DIAGNOSIS — J984 Other disorders of lung: Secondary | ICD-10-CM

## 2014-12-26 DIAGNOSIS — J939 Pneumothorax, unspecified: Secondary | ICD-10-CM

## 2014-12-26 MED ORDER — OXYCODONE HCL 5 MG PO TABS: 5.0000 mg | ORAL_TABLET | Freq: Four times a day (QID) | ORAL | Status: DC | PRN

## 2014-12-26 NOTE — Progress Notes (Signed)
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 SURGICAL PROCEDURES: Video bronchoscopy, left video-assisted thoracoscopy, left lower lobectomy, and division of abnormal systemic artery to the left lower lobe. SURGEON: Sheliah Plane, M.D.  History of Present Illness:          Past Medical History  Diagnosis Date  . GERD (gastroesophageal reflux disease)      History  Smoking status  . Never Smoker   Smokeless tobacco  . Not on file    History  Alcohol Use No     No Known Allergies  Current Outpatient Prescriptions  Medication Sig Dispense Refill  . dexlansoprazole (DEXILANT) 60 MG capsule Take 60 mg by mouth daily.    Marland Kitchen levocetirizine (XYZAL) 5 MG tablet Take 5 mg by mouth daily.    Marland Kitchen oxyCODONE (OXY IR/ROXICODONE) 5 MG immediate release tablet Take 1-2 tablets (5-10 mg total) by mouth every 4 (four) hours as needed for severe pain. 50 tablet 0   No current facility-administered medications for this visit.       Physical Exam: BP 124/85 mmHg  Pulse 84  Resp 16  Ht 5\' 8"  (1.727 m)  Wt 160 lb (72.576 kg)  BMI 24.33 kg/m2  SpO2 98%  General appearance: alert and cooperative Neurologic: intact Heart: regular rate and rhythm, S1, S2 normal, no murmur, click, rub or gallop Lungs: clear to auscultation bilaterally Abdomen: soft, non-tender; bowel sounds normal; no masses,  no organomegaly Extremities: extremities normal, atraumatic, no cyanosis or edema and Homans sign is negative, no sign of  DVT Wound: wounds healing well   Diagnostic Studies & Laboratory data:     Recent Radiology Findings:   Dg Chest 2 View  12/26/2014   CLINICAL DATA:  Shortness of breath.  Left lower lobectomy.  EXAM: CHEST  2 VIEW  COMPARISON:  11/18/2014 .  FINDINGS: Mediastinum and hilar structures are normal. Atelectasis and changes left lung base small left pleural effusion. Small left-sided pneumothorax. Right lung is clear. Heart size is normal. No acute bony abnormality .  IMPRESSION: Atelectasis and postsurgical changes left lung base status post left lower lobectomy. Small left-sided pneumothorax. Critical Value/emergent results were called by telephone at the time of interpretation on 12/26/2014 at 12:38 pm to nurse [facility], who verbally acknowledged these results.   Electronically Signed   By: Maisie Fus  Register   On: 12/26/2014 12:42      Recent Lab Findings: Lab Results  Component Value Date   WBC 8.7 12/08/2014   HGB 12.6* 12/08/2014   HCT 38.1* 12/08/2014   PLT 195 12/08/2014   GLUCOSE 132* 12/08/2014   ALT 56 12/08/2014   AST 23 12/08/2014   NA 140 12/08/2014   K 3.6 12/08/2014   CL 103 12/08/2014   CREATININE 0.74 12/08/2014   BUN 5* 12/08/2014   CO2 31 12/08/2014   INR 0.98 12/05/2014      Assessment / Plan:      Stable post op Will see back in 4 weeks and likely returned to  work 6-8 weeks postop    [name] MD      [address] [place] [place] [address] Office [phone]   Beeper [phone]  12/26/2014 1:15 PM

## 2015-01-21 ENCOUNTER — Other Ambulatory Visit: Payer: Self-pay | Admitting: Cardiothoracic Surgery

## 2015-01-21 DIAGNOSIS — Q332 Sequestration of lung: Secondary | ICD-10-CM

## 2015-01-23 ENCOUNTER — Ambulatory Visit
Admission: RE | Admit: 2015-01-23 | Discharge: 2015-01-23 | Disposition: A | Discharge status: Home / Self Care | Payer: 59 | Source: Ambulatory Visit | Attending: Cardiothoracic Surgery | Admitting: Cardiothoracic Surgery

## 2015-01-23 ENCOUNTER — Encounter: Payer: Self-pay | Admitting: Cardiothoracic Surgery

## 2015-01-23 ENCOUNTER — Ambulatory Visit (INDEPENDENT_AMBULATORY_CARE_PROVIDER_SITE_OTHER): Payer: Self-pay | Admitting: Cardiothoracic Surgery

## 2015-01-23 VITALS — BP 140/90 | HR 86 | Resp 20 | Ht 68.0 in | Wt 160.0 lb

## 2015-01-23 DIAGNOSIS — R918 Other nonspecific abnormal finding of lung field: Secondary | ICD-10-CM

## 2015-01-23 DIAGNOSIS — J939 Pneumothorax, unspecified: Secondary | ICD-10-CM

## 2015-01-23 DIAGNOSIS — Z902 Acquired absence of lung [part of]: Secondary | ICD-10-CM

## 2015-01-23 DIAGNOSIS — Q332 Sequestration of lung: Secondary | ICD-10-CM

## 2015-01-23 DIAGNOSIS — J984 Other disorders of lung: Secondary | ICD-10-CM

## 2015-01-23 NOTE — Progress Notes (Signed)
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 SURGICAL PROCEDURES: Video bronchoscopy, left video-assisted thoracoscopy, left lower lobectomy, and division of abnormal systemic artery to the left lower lobe. SURGEON: Sheliah Plane, M.D.  History of Present Illness:     Patient's doing recently well at home, he still has some anterior chest wall discomfort, but without direct incisional pain. He denies any shortness of breath.     Past Medical History  Diagnosis Date  . GERD (gastroesophageal reflux disease)      History  Smoking status  . Never Smoker   Smokeless tobacco  . Not on file    History  Alcohol Use No     No Known Allergies  Current Outpatient Prescriptions  Medication Sig Dispense Refill  . levocetirizine (XYZAL) 5 MG tablet Take 5 mg by mouth daily.    Marland Kitchen omeprazole (PRILOSEC) 40 MG capsule Take 40 mg by mouth daily.  11  . oxyCODONE (OXY IR/ROXICODONE) 5 MG immediate release tablet Take 1 tablet (5 mg total) by mouth every 6 (six) hours as needed for severe pain. 30 tablet 0   No current facility-administered medications for this visit.       Physical Exam: BP 140/90 mmHg  Pulse 86  Resp 20  Ht  (1.727 m)  Wt 160 lb (72.576 kg)  BMI 24.33 kg/m2  SpO2 98%  General appearance: alert and cooperative Neurologic: intact Heart: regular rate and rhythm, S1, S2 normal, no murmur, click, rub or gallop Lungs: clear to auscultation bilaterally Abdomen: soft, non-tender; bowel sounds  normal; no masses,  no organomegaly Extremities: extremities normal, atraumatic, no cyanosis or edema and Homans sign is negative, no sign of DVT Wound: wounds healing well   Diagnostic Studies & Laboratory data:     Recent Radiology Findings:   Dg Chest 2 View  01/23/2015   CLINICAL DATA:  Vats.  EXAM: CHEST  2 VIEW  COMPARISON:  12/26/2014.  11/18/2014.  FINDINGS: Mediastinum hilar structures are normal. No acute infiltrate. Left base pleural parenchymal thickening again noted consistent with small pleural effusion and/or scarring. Heart size stable. No pneumothorax. No acute bony abnormality.  IMPRESSION: Left base pleural parenchymal thickening again noted consistent with persistent small left pleural effusion and/or scarring. No acute abnormality.   Electronically Signed   By: Maisie Fus  Register   On: 01/23/2015 11:59      Recent Lab Findings: Lab Results  Component Value Date   WBC 8.7 12/08/2014   HGB 12.6* 12/08/2014   HCT 38.1* 12/08/2014   PLT 195 12/08/2014   GLUCOSE 132* 12/08/2014   ALT 56 12/08/2014   AST 23 12/08/2014   NA 140 12/08/2014   K 3.6 12/08/2014   CL 103 12/08/2014   CREATININE 0.74 12/08/2014   BUN 5* 12/08/2014   CO2 31 12/08/2014   INR 0.98 12/05/2014      Assessment / Plan:      Stable post op Will see back in 5 months with a follow-up chest x-ray His work  involves significant lifting so he will wait to return to work until 02/28/2015 without restrictions.    [name] MD      [address] [place] [place] [address] Office [phone]   Beeper [phone]  01/23/2015 12:32 PM

## 2015-06-25 ENCOUNTER — Other Ambulatory Visit: Payer: Self-pay | Admitting: Cardiothoracic Surgery

## 2015-06-25 DIAGNOSIS — J939 Pneumothorax, unspecified: Secondary | ICD-10-CM

## 2015-06-26 ENCOUNTER — Ambulatory Visit
Admission: RE | Admit: 2015-06-26 | Discharge: 2015-06-26 | Disposition: A | Discharge status: Home / Self Care | Payer: 59 | Source: Ambulatory Visit | Attending: Cardiothoracic Surgery | Admitting: Cardiothoracic Surgery

## 2015-06-26 ENCOUNTER — Encounter: Payer: Self-pay | Admitting: Cardiothoracic Surgery

## 2015-06-26 ENCOUNTER — Ambulatory Visit (INDEPENDENT_AMBULATORY_CARE_PROVIDER_SITE_OTHER): Payer: 59 | Admitting: Cardiothoracic Surgery

## 2015-06-26 VITALS — BP 140/88 | HR 100 | Resp 20 | Ht 68.0 in | Wt 172.0 lb

## 2015-06-26 DIAGNOSIS — Z902 Acquired absence of lung [part of]: Secondary | ICD-10-CM

## 2015-06-26 DIAGNOSIS — J939 Pneumothorax, unspecified: Secondary | ICD-10-CM

## 2015-06-26 DIAGNOSIS — R918 Other nonspecific abnormal finding of lung field: Secondary | ICD-10-CM

## 2015-06-26 DIAGNOSIS — J984 Other disorders of lung: Secondary | ICD-10-CM | POA: Diagnosis not present

## 2015-06-26 NOTE — Progress Notes (Signed)
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 SURGICAL PROCEDURES: Video bronchoscopy, left video-assisted thoracoscopy, left lower lobectomy, and division of abnormal systemic artery to the left lower lobe. SURGEON: Sheliah Plane, M.D.  History of Present Illness:     Patient doing well following presentation in August with major hemoptysis with probable sequestration of left lower lobe. He underwent division of systemic artery to the left lower lobe and left lower lobectomy. He's had no postop sequelae. Has normal breathing capacity. Has had no further hemoptysis.     Past Medical History  Diagnosis Date  . GERD (gastroesophageal reflux disease)      History  Smoking status  . Never Smoker   Smokeless tobacco  . Not on file    History  Alcohol Use No     No Known Allergies  Current Outpatient Prescriptions  Medication Sig Dispense Refill  . levocetirizine (XYZAL) 5 MG tablet Take 5 mg by mouth daily.    Marland Kitchen omeprazole (PRILOSEC) 40 MG capsule Take 40 mg by mouth daily.  11   No current facility-administered medications for this visit.       Physical Exam: BP 140/88 mmHg  Pulse 100  Resp 20  Ht  (1.727 m)  Wt 172 lb (78.019 kg)  BMI 26.16 kg/m2  SpO2 98%  General appearance: alert and cooperative Neurologic: intact Heart: regular rate and rhythm, S1, S2 normal, no murmur, click, rub or gallop Lungs: clear to auscultation bilaterally Abdomen: soft, non-tender; bowel sounds normal; no  masses,  no organomegaly Extremities: extremities normal, atraumatic, no cyanosis or edema and Homans sign is negative, no sign of DVT Wound: wounds healing well   Diagnostic Studies & Laboratory data:     Recent Radiology Findings:   Dg Chest 2 View  06/26/2015  CLINICAL DATA:  History of previous pneumothorax with left-sided VATS in 2016 EXAM: CHEST  2 VIEW COMPARISON:  01/23/2015 FINDINGS: Cardiac shadow is stable. Improved aeration in the left lung base is noted. No definitive pneumothorax is seen. Mild right basilar atelectasis is noted. No acute bony abnormality is seen. IMPRESSION: Mild right basilar atelectasis. Resolution of changes in the left lung base. Electronically Signed   By: Alcide Clever M.D.   On: 06/26/2015 09:33    I have independently reviewed the above radiology studies  and reviewed the findings with the patient.   Recent Lab Findings: Lab Results  Component Value Date   WBC 8.7 12/08/2014   HGB 12.6* 12/08/2014   HCT 38.1* 12/08/2014   PLT 195 12/08/2014   GLUCOSE 132* 12/08/2014   ALT 56 12/08/2014   AST 23 12/08/2014   NA 140 12/08/2014   K 3.6 12/08/2014   CL 103 12/08/2014   CREATININE 0.74 12/08/2014   BUN 5* 12/08/2014   CO2 31 12/08/2014   INR 0.98 12/05/2014      Assessment / Plan:    Patient doing well following left lower lobectomy for pulmonary sequestration and hemoptysis.  Patient does not need any further  thoracic surgery follow-up at this point. I've encouraged and to stay up-to-date with flu vaccination  Delight OvensEdward B Deloss Amico MD      [address] [place] [id number] Office [phone]   Beeper [phone]  06/26/2015 9:40 AM

## 2016-03-08 IMAGING — DX DG CHEST 2V
2 series · 2 of 2 positions shown · non-contrast
Comparison: 12/01/2014

CLINICAL DATA: Hemoptysis.  Scheduled for surgery.

EXAM:
CHEST  2 VIEW

[chest pa]
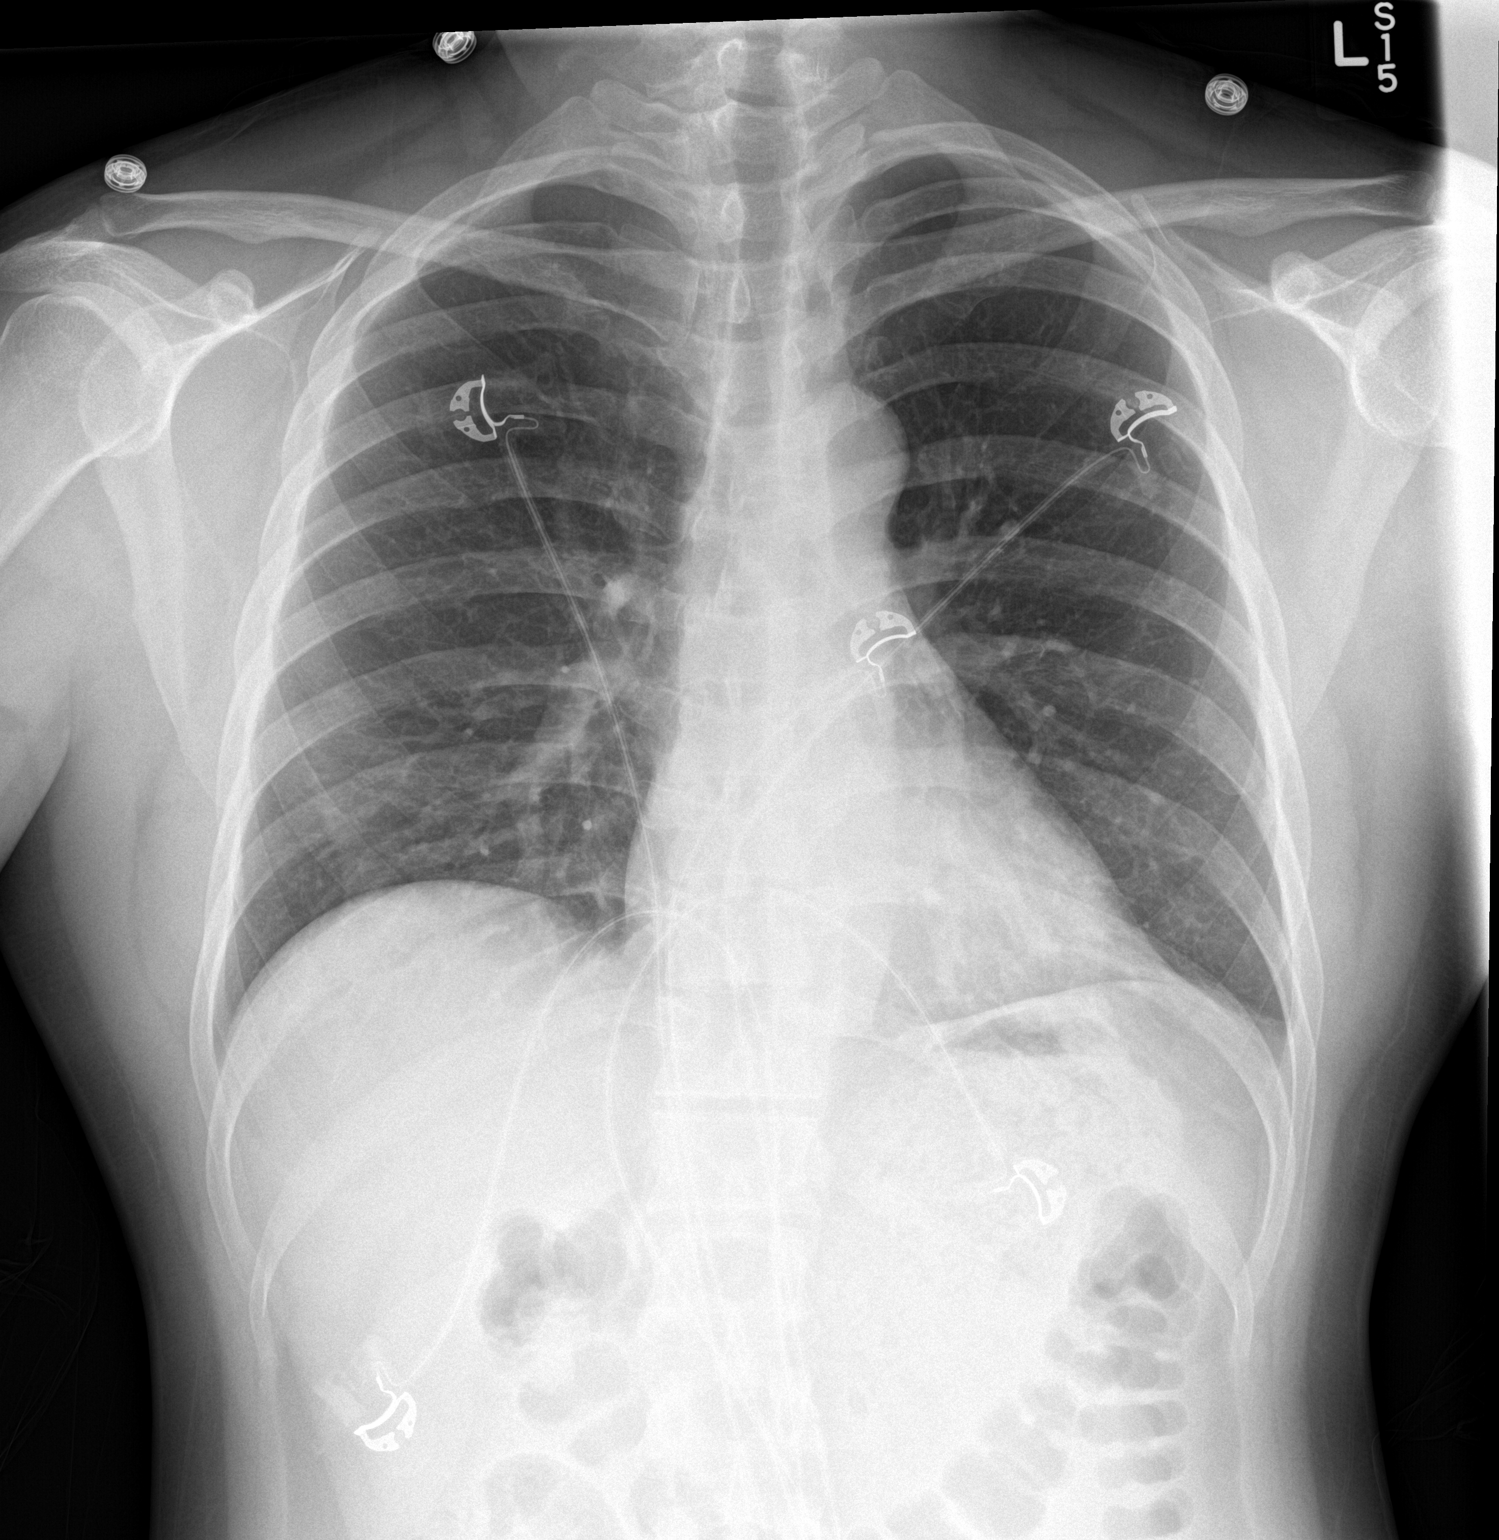

[chest lat]
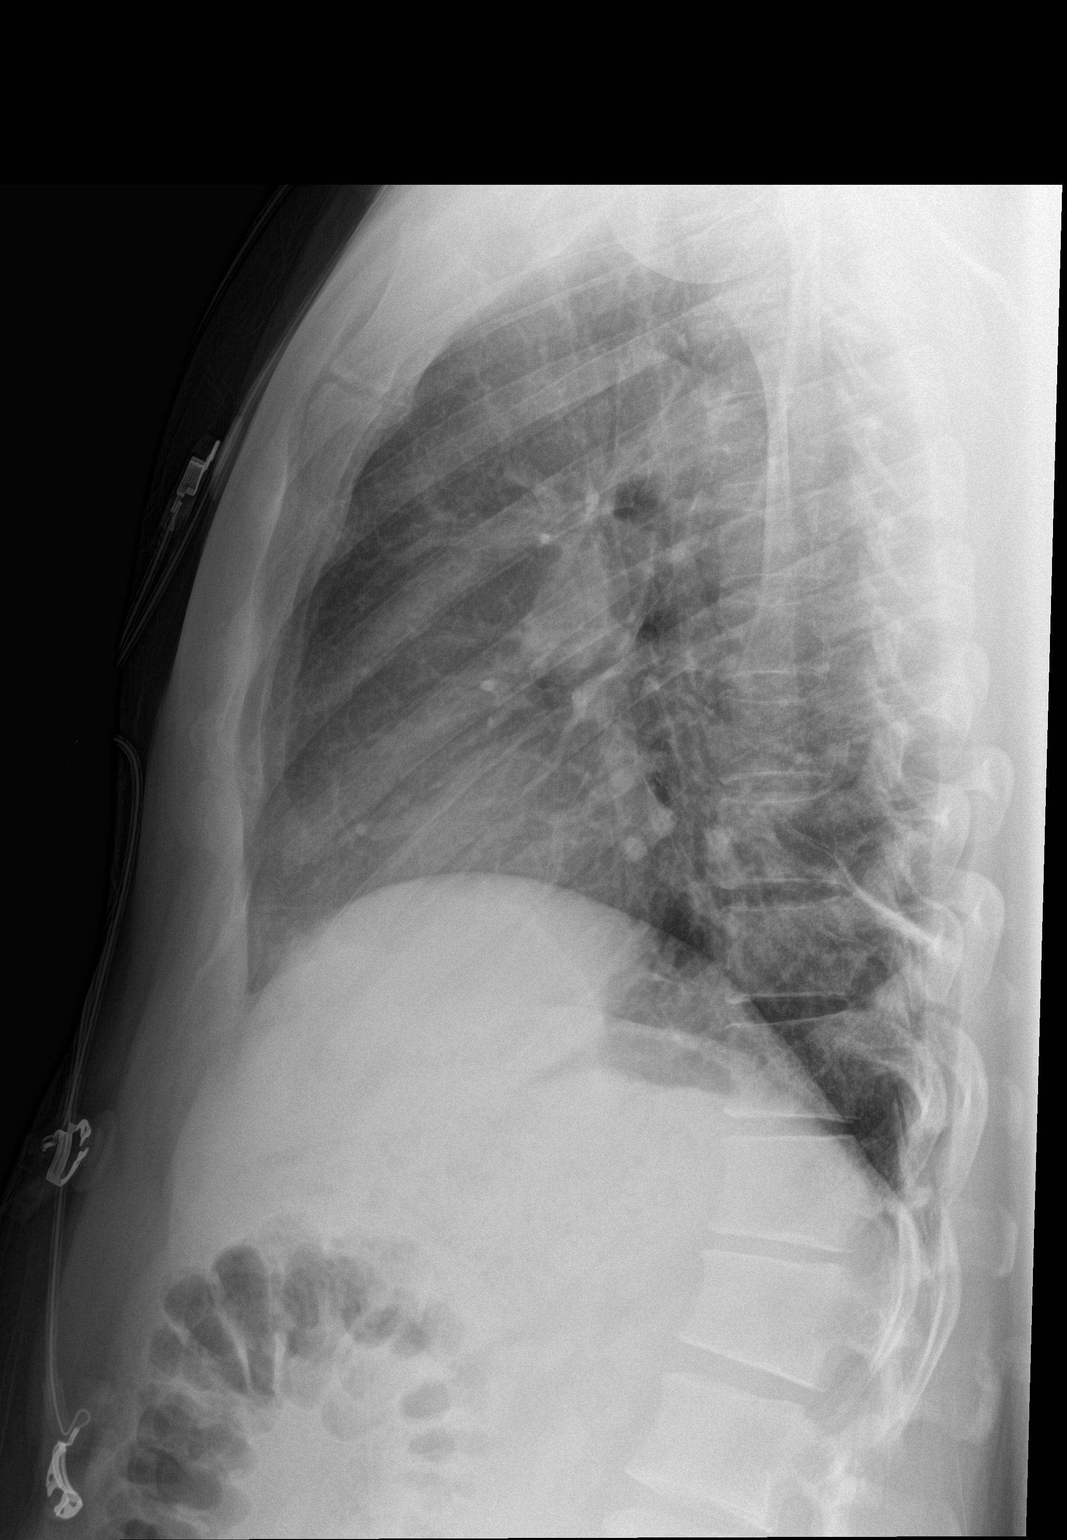

[2 of 2 positions shown; findings below may reference images not displayed]

FINDINGS: Few densities at the left lung base are suggestive for atelectasis.
Otherwise, the lungs are clear. Heart and mediastinum are within
normal limits. The trachea is midline. No pleural effusions. No
acute bone abnormality.
IMPRESSION: Left basilar atelectasis.

## 2016-03-09 IMAGING — CR DG CHEST 1V PORT
1 series · 1 of 1 positions shown · non-contrast
Comparison: December 05, 2014

CLINICAL DATA: Status post left lobectomy with central catheter
placement

EXAM:
PORTABLE CHEST - 1 VIEW

[AP]
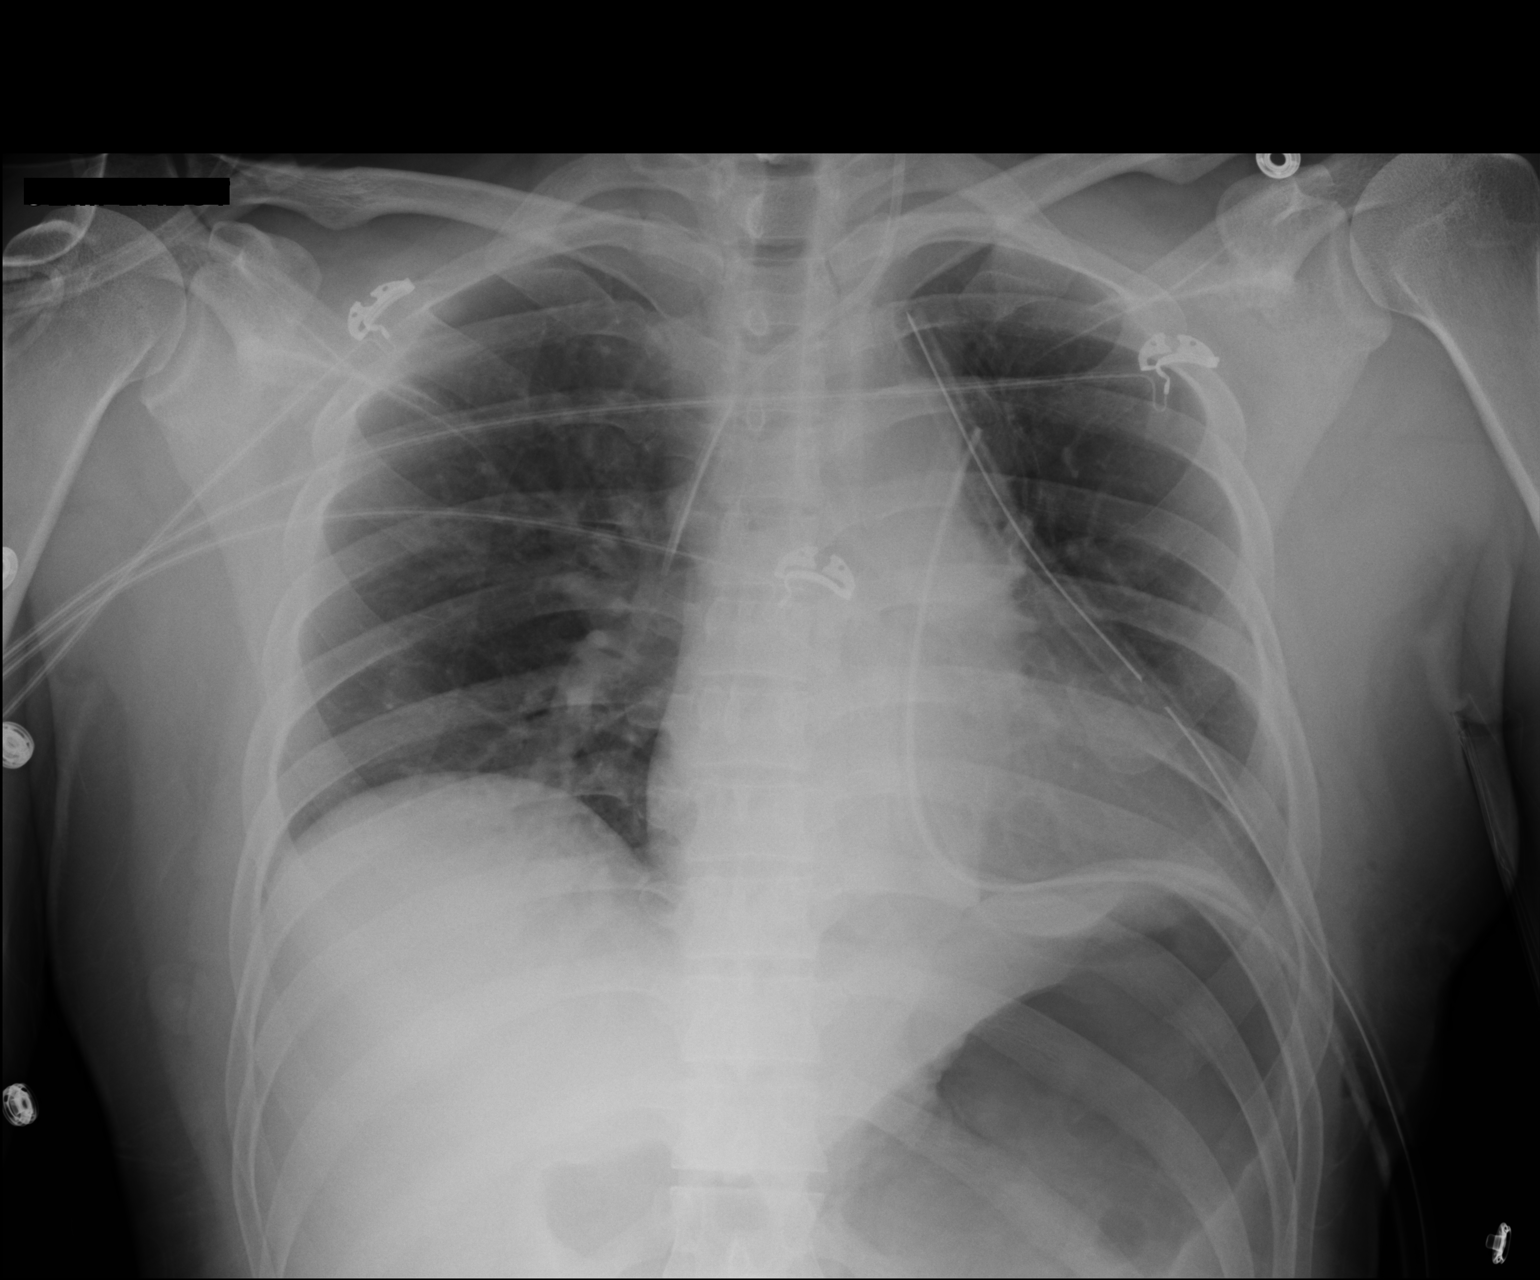

[1 of 1 positions shown; findings below may reference images not displayed]

FINDINGS: There are 2 chest tubes on the left. No pneumothorax. Central
catheter tip is in the superior vena cava.

No edema or consolidation. Heart size and pulmonary vascularity are
normal. No adenopathy. No bone lesions.
IMPRESSION: Chest tubes on the left without apparent pneumothorax. Central
catheter tip in superior vena cava. No edema or consolidation.
Cardiac silhouette within normal limits.

## 2016-03-11 IMAGING — CR DG CHEST 1V PORT
1 series · 1 of 1 positions shown · non-contrast
Comparison: Chest radiograph 12/07/2014

CLINICAL DATA: Patient status post left-sided thoracotomy. Central
line placement.

EXAM:
PORTABLE CHEST - 1 VIEW

[AP]
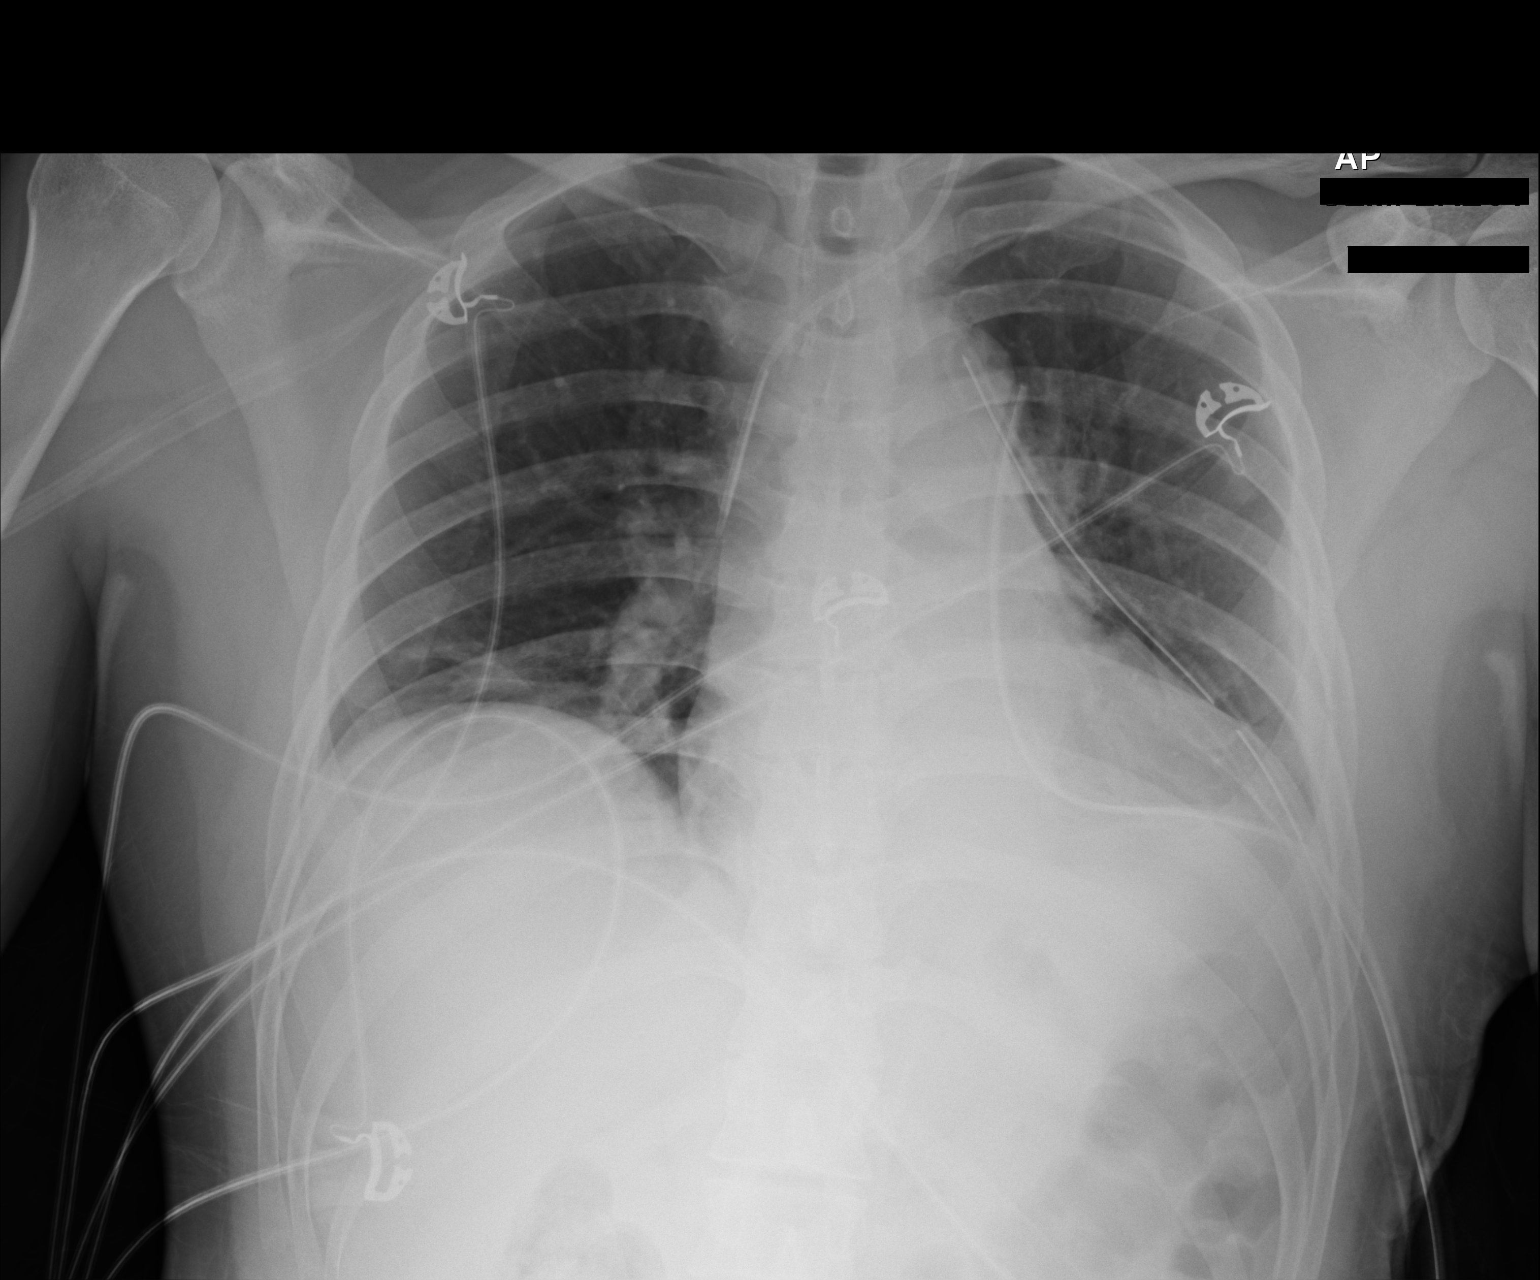

[1 of 1 positions shown; findings below may reference images not displayed]

FINDINGS: Left IJ central venous catheter tip projects over the superior vena
cava. Left-sided chest tubes are in place. Monitoring leads overlie
the patient. Stable cardiac and mediastinal contours. Low lung
volumes. Minimal right basilar atelectasis. No definite pleural
effusion or pneumothorax.
IMPRESSION: Stable chest radiograph.  Stable support apparatus.

## 2016-03-13 IMAGING — DX DG CHEST 2V
2 series · 2 of 2 positions shown · non-contrast
Comparison: 12/09/2014

CLINICAL DATA: Pneumothorax, post cardiac surgery.

EXAM:
CHEST  2 VIEW

[chest pa]
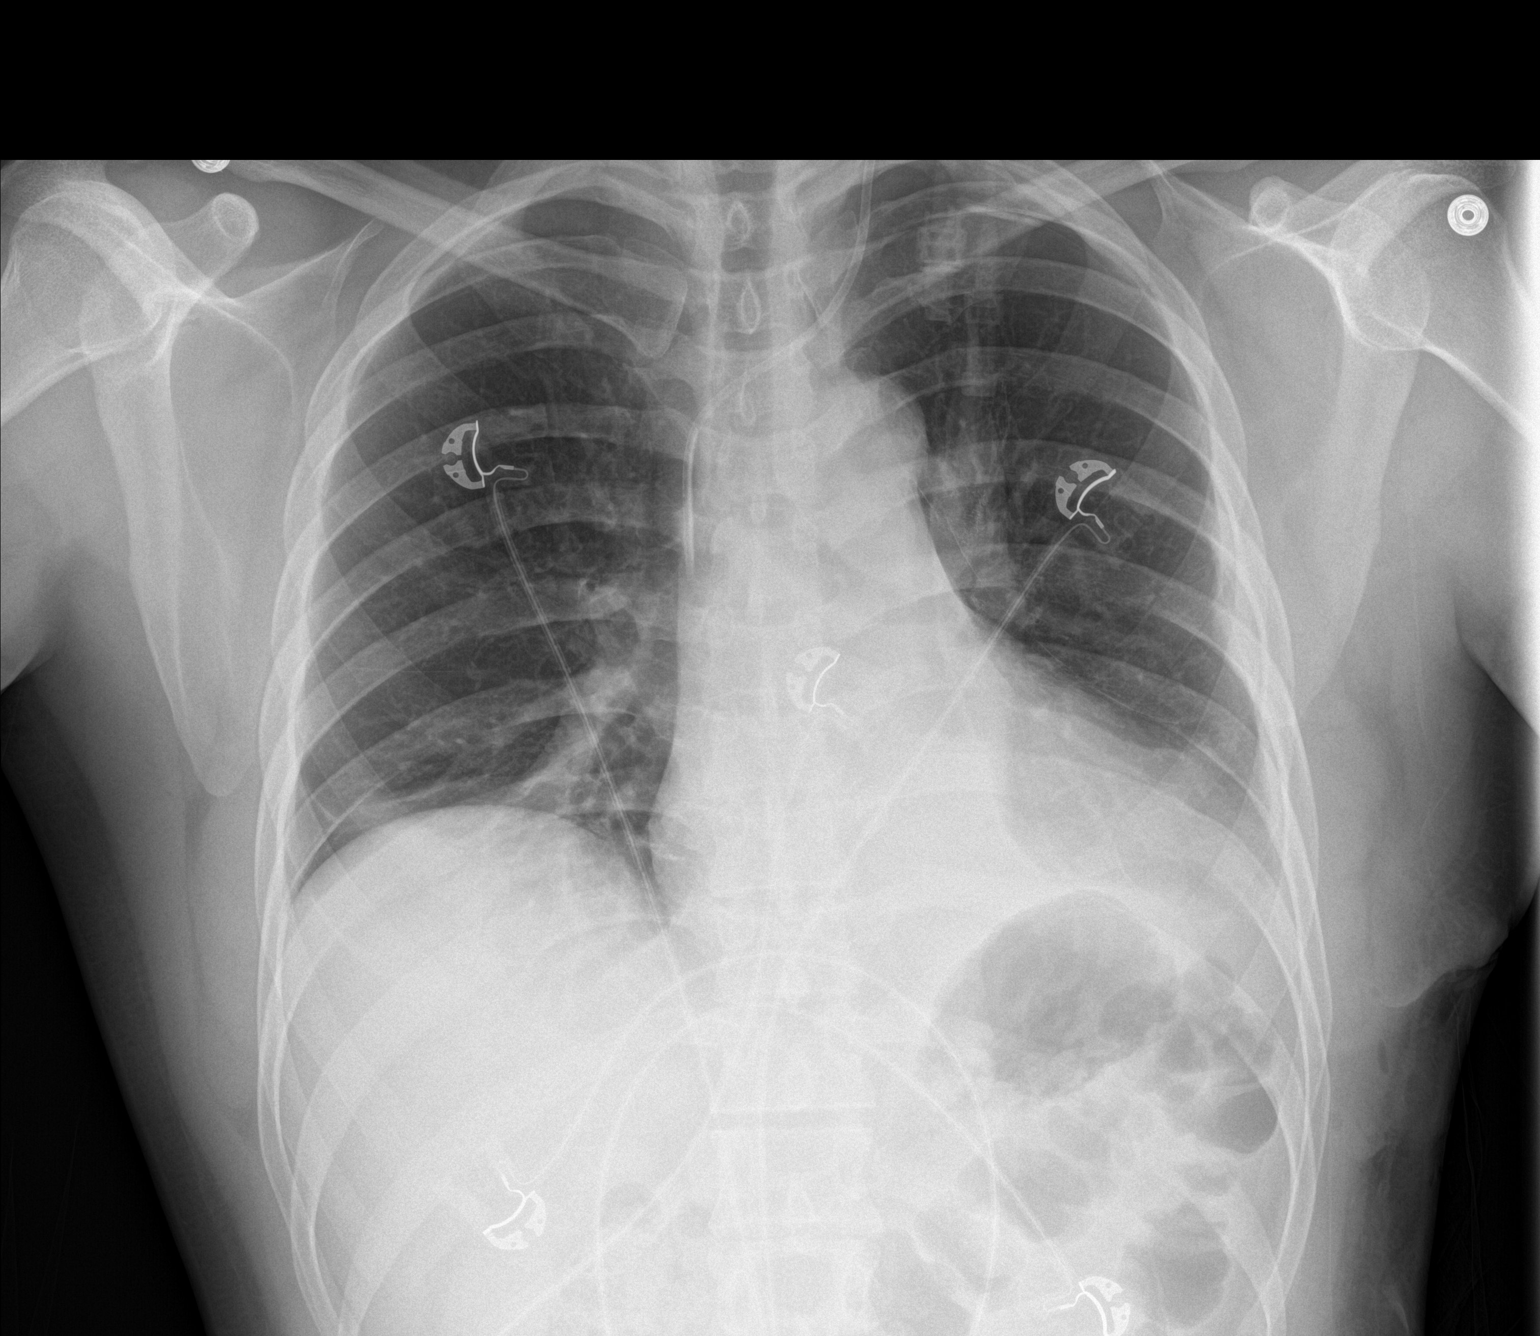

[chest lat]
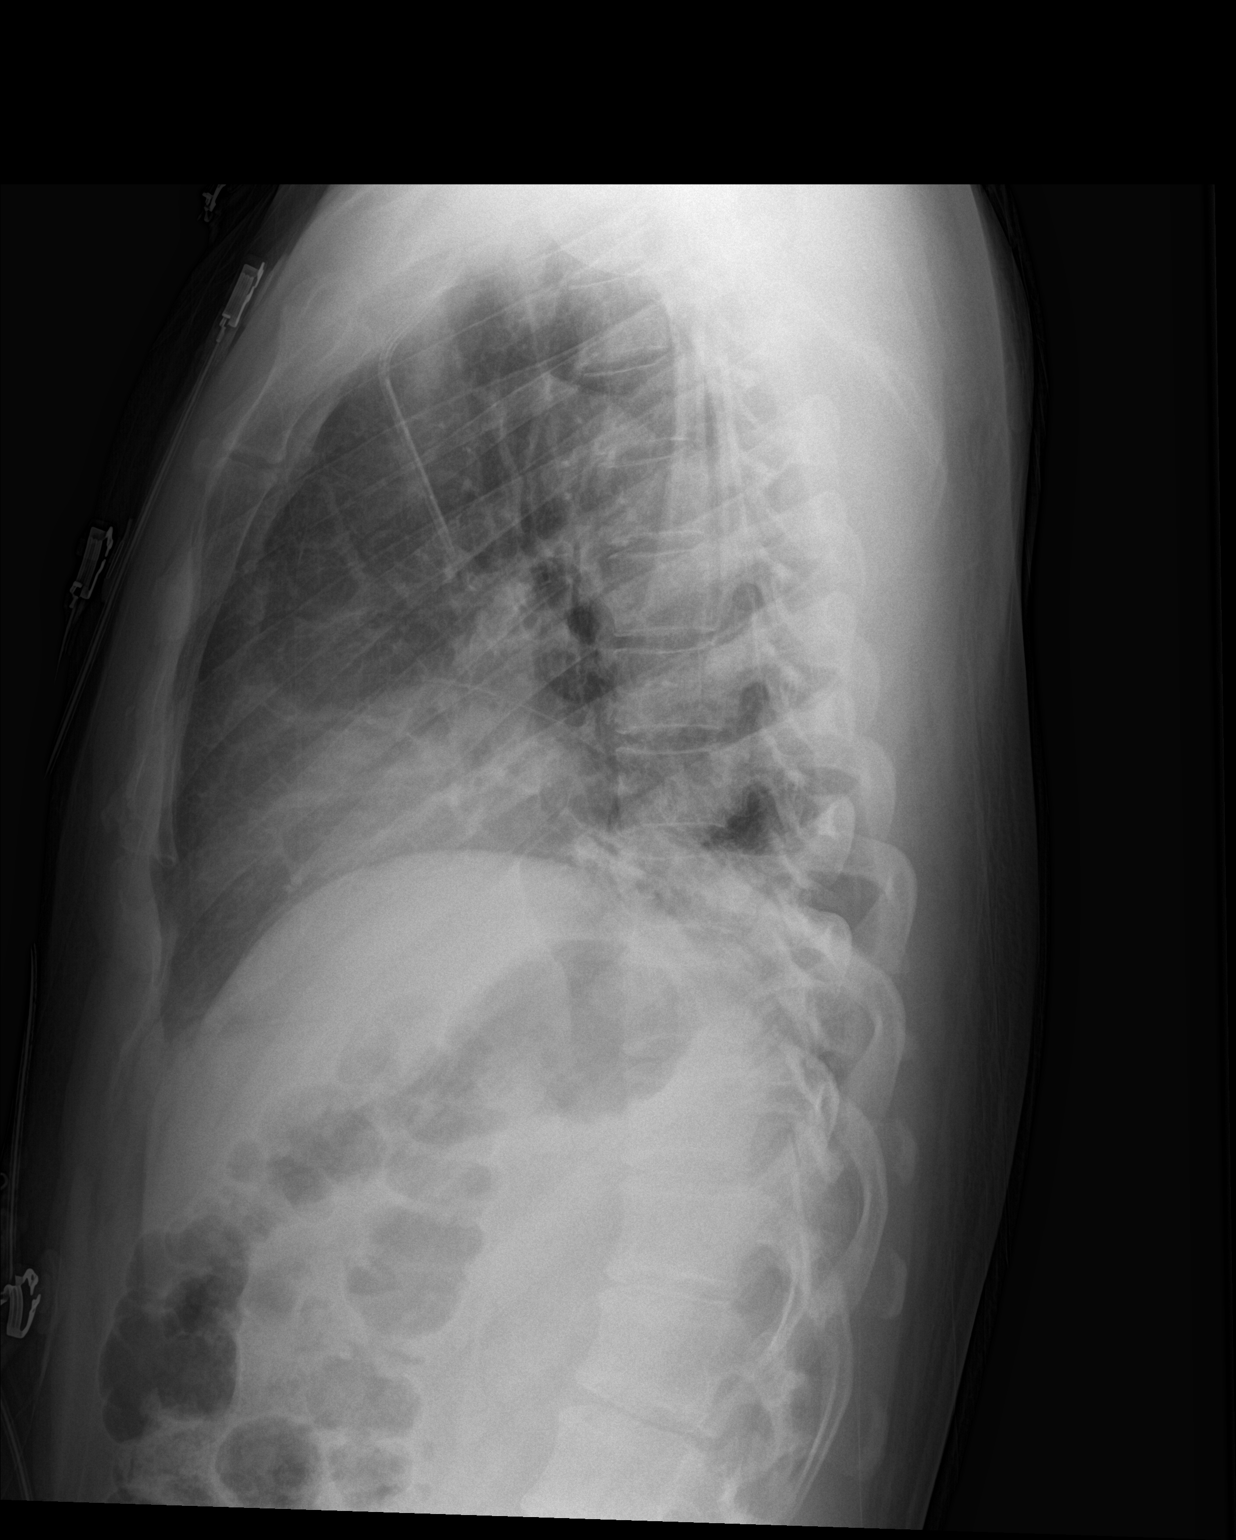

[2 of 2 positions shown; findings below may reference images not displayed]

FINDINGS: Left chest tube has been removed. Small residual left pneumothorax,
approximately 5%. Left central line remains in place, unchanged.
There is bibasilar atelectasis. No effusions. Heart is normal size.
IMPRESSION: Small left apical pneumothorax, approximately 5% after removal of
left chest tube.

Bibasilar atelectasis.

## 2016-05-25 ENCOUNTER — Other Ambulatory Visit: Payer: Self-pay | Admitting: Family Medicine

## 2016-05-25 ENCOUNTER — Ambulatory Visit
Admission: RE | Admit: 2016-05-25 | Discharge: 2016-05-25 | Disposition: A | Discharge status: Home / Self Care | Payer: 59 | Source: Ambulatory Visit | Attending: Family Medicine | Admitting: Family Medicine

## 2016-05-25 DIAGNOSIS — R042 Hemoptysis: Secondary | ICD-10-CM

## 2016-05-25 MED ORDER — IOPAMIDOL (ISOVUE-300) INJECTION 61%
75.0000 mL | Freq: Once | INTRAVENOUS | Status: AC | PRN
  Administered 2016-05-25: 75 mL via INTRAVENOUS

## 2016-06-29 ENCOUNTER — Institutional Professional Consult (permissible substitution): Payer: 59 | Admitting: Internal Medicine

## 2023-06-07 ENCOUNTER — Other Ambulatory Visit: Payer: Self-pay | Admitting: Family Medicine

## 2023-06-07 DIAGNOSIS — R7401 Elevation of levels of liver transaminase levels: Secondary | ICD-10-CM

## 2023-06-16 ENCOUNTER — Ambulatory Visit
Admission: RE | Admit: 2023-06-16 | Discharge: 2023-06-16 | Disposition: A | Discharge status: Home / Self Care | Payer: 59 | Source: Ambulatory Visit | Attending: Family Medicine | Admitting: Family Medicine

## 2023-06-16 DIAGNOSIS — R7401 Elevation of levels of liver transaminase levels: Secondary | ICD-10-CM

## 2023-07-13 ENCOUNTER — Encounter: Payer: Self-pay | Admitting: Pediatrics

## 2023-08-03 ENCOUNTER — Ambulatory Visit

## 2023-08-03 VITALS — Ht 68.0 in | Wt 168.0 lb

## 2023-08-03 DIAGNOSIS — Z1211 Encounter for screening for malignant neoplasm of colon: Secondary | ICD-10-CM

## 2023-08-03 MED ORDER — NA SULFATE-K SULFATE-MG SULF 17.5-3.13-1.6 GM/177ML PO SOLN: 1.0000 | Freq: Once | ORAL | 0 refills | Status: AC

## 2023-08-03 NOTE — Progress Notes (Signed)
 No egg or soy allergy known to patient  No issues known to pt with past sedation with any surgeries or procedures Patient denies ever being told they had issues or difficulty with intubation  No FH of Malignant Hyperthermia Pt is not on diet pills nor GLP-1 medications Pt is not on  home 02  Pt is not on blood thinners  Pt denies issues with chronic constipation  No A fib or A flutter Have any cardiac testing pending--no Pt instructed to use [email or web address] or GoodRx for a price reduction on prep  Ambulates independently

## 2023-08-11 ENCOUNTER — Encounter: Payer: Self-pay | Admitting: Pediatrics

## 2023-08-16 NOTE — Progress Notes (Unsigned)
 [facility] History and Physical   Primary Care Physician:  Olin Bertin, MD   Reason for Procedure:  Colorectal cancer screening  Plan:    Screening colonoscopy     HPI: Henry Guerrero is a 48 y.o. male undergoing screening colonoscopy for colorectal cancer screening.  This is the patient's first colonoscopy.  No family history of colorectal cancer or polyps.  Patient denies current symptoms of change in bowel habits or rectal bleeding.   Past Medical History:  Diagnosis Date   GERD (gastroesophageal reflux disease)     Past Surgical History:  Procedure Laterality Date   VIDEO ASSISTED THORACOSCOPY (VATS)/ LOBECTOMY Left 12/06/2014   Procedure: VIDEO ASSISTED THORACOSCOPY (VATS)/LEFT LOWER LOBECTOMY;  Surgeon: Norita Beauvais, MD;  Location: [facility] OR;  Service: Thoracic;  Laterality: Left;   VIDEO BRONCHOSCOPY N/A 12/06/2014   Procedure: VIDEO BRONCHOSCOPY;  Surgeon: Norita Beauvais, MD;  Location: [facility] OR;  Service: Thoracic;  Laterality: N/A;    Prior to Admission medications   Medication Sig Start Date End Date Taking? Authorizing Provider  amLODipine (NORVASC) 5 MG tablet Take 5 mg by mouth daily. 07/10/23   [provider]  cetirizine  (ZYRTEC ) 10 MG chewable tablet Chew 10 mg by mouth daily as needed for allergies.    [provider]  omeprazole (PRILOSEC) 40 MG capsule Take 40 mg by mouth daily. Patient not taking: Reported on 08/03/2023 01/14/15   [provider]  rizatriptan (MAXALT) 5 MG tablet Take 5 mg by mouth as needed. 07/10/23   [provider]    Current Outpatient Medications  Medication Sig Dispense Refill   amLODipine (NORVASC) 5 MG tablet Take 5 mg by mouth daily.     cetirizine  (ZYRTEC ) 10 MG chewable tablet Chew 10 mg by mouth daily as needed for allergies.     omeprazole (PRILOSEC) 40 MG capsule Take 40 mg by mouth daily. (Patient not taking: Reported on 08/03/2023)  11   rizatriptan (MAXALT) 5 MG tablet  Take 5 mg by mouth as needed.     No current facility-administered medications for this visit.    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  Independent of ADLs.   Social Drivers of Corporate investment banker Strain: Not on file  Food Insecurity: Not on file  Transportation Needs: Not on file  Physical Activity: Not on file  Stress: Not on file  Social Connections: Not on file  Intimate Partner Violence: Not on file    Review of Systems:  All other review of systems negative except as mentioned in the HPI.  Physical Exam: Vital signs There were no vitals taken for this visit.  General:   Alert,  Well-developed, well-nourished, pleasant and cooperative in NAD Airway:  Mallampati  Lungs:  Clear throughout to auscultation.   Heart:  Regular rate and rhythm; no murmurs, clicks, rubs,  or gallops. Abdomen:  Soft, nontender and nondistended. Normal bowel sounds.   Neuro/Psych:  Normal mood and affect. A and O x 3  Eugenia Hess, MD [facility]

## 2023-08-17 ENCOUNTER — Encounter: Payer: Self-pay | Admitting: Pediatrics

## 2023-08-17 ENCOUNTER — Ambulatory Visit: Admitting: Pediatrics

## 2023-08-17 VITALS — BP 118/83 | HR 83 | Temp 98.2°F | Resp 22 | Ht 68.0 in | Wt 168.0 lb

## 2023-08-17 DIAGNOSIS — K648 Other hemorrhoids: Secondary | ICD-10-CM

## 2023-08-17 DIAGNOSIS — K5730 Diverticulosis of large intestine without perforation or abscess without bleeding: Secondary | ICD-10-CM

## 2023-08-17 DIAGNOSIS — Z1211 Encounter for screening for malignant neoplasm of colon: Secondary | ICD-10-CM | POA: Diagnosis present

## 2023-08-17 MED ORDER — SODIUM CHLORIDE 0.9 % IV SOLN: 500.0000 mL | Freq: Once | INTRAVENOUS | Status: DC

## 2023-08-17 NOTE — Op Note (Signed)
 [facility] Patient Name: Henry Guerrero Procedure Date: 08/17/2023 9:54 AM MRN: [id number] Endoscopist: Eugenia Hess , MD, [id number] Age: 48 Referring MD:  Date of Birth: Mar 10, 1976 Gender: Male Account #: [id number] Procedure:                Colonoscopy Indications:              Screening for colorectal malignant neoplasm, This                            is the patient's first colonoscopy Medicines:                Monitored Anesthesia Care Procedure:                Pre-Anesthesia Assessment:                           - Prior to the procedure, a History and Physical                            was performed, and patient medications and                            allergies were reviewed. The patient's tolerance of                            previous anesthesia was also reviewed. The risks                            and benefits of the procedure and the sedation                            options and risks were discussed with the patient.                            All questions were answered, and informed consent                            was obtained. Prior Anticoagulants: The patient has                            taken no anticoagulant or antiplatelet agents. ASA                            Grade Assessment: II - A patient with mild systemic                            disease. After reviewing the risks and benefits,                            the patient was deemed in satisfactory condition to                            undergo the procedure.  After obtaining informed consent, the colonoscope                            was passed under direct vision. Throughout the                            procedure, the patient's blood pressure, pulse, and                            oxygen saturations were monitored continuously. The                            Olympus Scope [id number] was introduced through the                            anus and advanced to the  terminal ileum. The                            colonoscopy was performed without difficulty. The                            patient tolerated the procedure well. The quality                            of the bowel preparation was good. The terminal                            ileum, ileocecal valve, appendiceal orifice, and                            rectum were photographed. Scope In: 10:00:22 AM Scope Out: 10:09:48 AM Scope Withdrawal Time: 0 hours 6 minutes 31 seconds  Total Procedure Duration: 0 hours 9 minutes 26 seconds  Findings:                 The perianal and digital rectal examinations were                            normal. Pertinent negatives include normal                            sphincter tone and no palpable rectal lesions.                           The colon (entire examined portion) appeared normal.                           A single small-mouthed diverticulum was found in                            the sigmoid colon and cecum.                           The terminal ileum appeared normal.  Internal hemorrhoids were found during retroflexion. Complications:            No immediate complications. Estimated Blood Loss:     Estimated blood loss: none. Impression:               - The entire examined colon is normal.                           - The examined portion of the ileum was normal.                           - Internal hemorrhoids.                           - No specimens collected. Recommendation:           - Discharge patient to home (ambulatory).                           - Repeat colonoscopy in 10 years for screening                            purposes.                           - The findings and recommendations were discussed                            with the patient's family.                           - Return to referring physician.                           - Patient has a contact number available for                             emergencies. The signs and symptoms of potential                            delayed complications were discussed with the                            patient. Return to normal activities tomorrow.                            Written discharge instructions were provided to the                            patient. Eugenia Hess, MD 08/17/2023 10:15:07 AM This report has been signed electronically.

## 2023-08-17 NOTE — Progress Notes (Signed)
 Pt's states no medical or surgical changes since previsit or office visit.

## 2023-08-17 NOTE — Progress Notes (Signed)
 Vss nad trans to pacu

## 2023-08-17 NOTE — Patient Instructions (Signed)
 Resume previous diet and medications.  Repeat colonoscopy in 10 years.  Education attached on hemorrhoids.    YOU HAD AN ENDOSCOPIC PROCEDURE TODAY AT THE [facility]:   Refer to the procedure report that was given to you for any specific questions about what was found during the examination.  If the procedure report does not answer your questions, please call your gastroenterologist to clarify.  If you requested that your care partner not be given the details of your procedure findings, then the procedure report has been included in a sealed envelope for you to review at your convenience later.  YOU SHOULD EXPECT: Some feelings of bloating in the abdomen. Passage of more gas than usual.  Walking can help get rid of the air that was put into your GI tract during the procedure and reduce the bloating. If you had a lower endoscopy (such as a colonoscopy or flexible sigmoidoscopy) you may notice spotting of blood in your stool or on the toilet paper. If you underwent a bowel prep for your procedure, you may not have a normal bowel movement for a few days.  Please Note:  You might notice some irritation and congestion in your nose or some drainage.  This is from the oxygen used during your procedure.  There is no need for concern and it should clear up in a day or so.  SYMPTOMS TO REPORT IMMEDIATELY:  Following lower endoscopy (colonoscopy or flexible sigmoidoscopy):  Excessive amounts of blood in the stool  Significant tenderness or worsening of abdominal pains  Swelling of the abdomen that is new, acute  Fever of 100F or higher  For urgent or emergent issues, a gastroenterologist can be reached at any hour by calling [phone]. Do not use MyChart messaging for urgent concerns.    DIET:  We do recommend a small meal at first, but then you may proceed to your regular diet.  Drink plenty of fluids but you should avoid alcoholic beverages for 24 hours.  ACTIVITY:  You should plan  to take it easy for the rest of today and you should NOT DRIVE or use heavy machinery until tomorrow (because of the sedation medicines used during the test).    FOLLOW UP: Our staff will call the number listed on your records the next business day following your procedure.  We will call around 7:15- 8:00 am to check on you and address any questions or concerns that you may have regarding the information given to you following your procedure. If we do not reach you, we will leave a message.     If any biopsies were taken you will be contacted by phone or by letter within the next 1-3 weeks.  Please call us  at [phone] if you have not heard about the biopsies in 3 weeks.    SIGNATURES/CONFIDENTIALITY: You and/or your care partner have signed paperwork which will be entered into your electronic medical record.  These signatures attest to the fact that that the information above on your After Visit Summary has been reviewed and is understood.  Full responsibility of the confidentiality of this discharge information lies with you and/or your care-partner.

## 2023-08-18 ENCOUNTER — Telehealth: Payer: Self-pay

## 2023-08-18 NOTE — Telephone Encounter (Signed)
 No answer after follow up call. Voice message left.
